# Patient Record
Sex: Male | Born: 1980 | Race: Black or African American | Hispanic: No | Marital: Single | Smoking: Former smoker
Health system: Southern US, Community
[De-identification: ages and names within clinical notes are randomized; demographics above are authoritative.]

## PROBLEM LIST (undated history)

## (undated) DIAGNOSIS — F12288 Cannabis dependence with other cannabis-induced disorder: Secondary | ICD-10-CM

---

## 2012-04-03 ENCOUNTER — Emergency Department (HOSPITAL_COMMUNITY)
Admission: EM | Admit: 2012-04-03 | Discharge: 2012-04-03 | Disposition: A | Discharge status: Home / Self Care | Payer: Self-pay | Attending: Emergency Medicine | Admitting: Emergency Medicine

## 2012-04-03 ENCOUNTER — Encounter (HOSPITAL_COMMUNITY): Payer: Self-pay | Admitting: *Deleted

## 2012-04-03 DIAGNOSIS — R198 Other specified symptoms and signs involving the digestive system and abdomen: Secondary | ICD-10-CM | POA: Insufficient documentation

## 2012-04-03 DIAGNOSIS — R6883 Chills (without fever): Secondary | ICD-10-CM | POA: Insufficient documentation

## 2012-04-03 DIAGNOSIS — R1110 Vomiting, unspecified: Secondary | ICD-10-CM | POA: Insufficient documentation

## 2012-04-03 DIAGNOSIS — M549 Dorsalgia, unspecified: Secondary | ICD-10-CM | POA: Insufficient documentation

## 2012-04-03 LAB — CBC WITH DIFFERENTIAL/PLATELET
Basophils Absolute: 0 10*3/uL (ref 0.0–0.1)
Basophils Relative: 0 % (ref 0–1)
Eosinophils Absolute: 0 10*3/uL (ref 0.0–0.7)
Hemoglobin: 14.4 g/dL (ref 13.0–17.0)
MCH: 29.8 pg (ref 26.0–34.0)
MCHC: 35.2 g/dL (ref 30.0–36.0)
Neutro Abs: 10.8 10*3/uL — ABNORMAL HIGH (ref 1.7–7.7)
Neutrophils Relative %: 87 % — ABNORMAL HIGH (ref 43–77)
Platelets: 195 10*3/uL (ref 150–400)
RDW: 15 % (ref 11.5–15.5)

## 2012-04-03 LAB — COMPREHENSIVE METABOLIC PANEL
AST: 104 U/L — ABNORMAL HIGH (ref 0–37)
Albumin: 3.9 g/dL (ref 3.5–5.2)
Alkaline Phosphatase: 52 U/L (ref 39–117)
BUN: 9 mg/dL (ref 6–23)
Chloride: 99 mEq/L (ref 96–112)
Potassium: 4 mEq/L (ref 3.5–5.1)
Sodium: 135 mEq/L (ref 135–145)
Total Bilirubin: 0.4 mg/dL (ref 0.3–1.2)
Total Protein: 7.2 g/dL (ref 6.0–8.3)

## 2012-04-03 LAB — URINALYSIS, ROUTINE W REFLEX MICROSCOPIC
Bilirubin Urine: NEGATIVE
Glucose, UA: NEGATIVE mg/dL
Leukocytes, UA: NEGATIVE
Nitrite: NEGATIVE
Specific Gravity, Urine: 1.029 (ref 1.005–1.030)
pH: 6.5 (ref 5.0–8.0)

## 2012-04-03 LAB — LIPASE, BLOOD: Lipase: 17 U/L (ref 11–59)

## 2012-04-03 MED ORDER — SODIUM CHLORIDE 0.9 % IV SOLN
Freq: Once | INTRAVENOUS | Status: AC
  Administered 2012-04-03: 07:00:00 via INTRAVENOUS

## 2012-04-03 MED ORDER — PROMETHAZINE HCL 25 MG PO TABS: 25.0000 mg | ORAL_TABLET | Freq: Four times a day (QID) | ORAL | Status: DC | PRN

## 2012-04-03 MED ORDER — ONDANSETRON HCL 4 MG/2ML IJ SOLN
4.0000 mg | Freq: Once | INTRAMUSCULAR | Status: AC
  Administered 2012-04-03: 4 mg via INTRAVENOUS
  Filled 2012-04-03: qty 2

## 2012-04-03 MED ORDER — HYDROMORPHONE HCL PF 1 MG/ML IJ SOLN
1.0000 mg | Freq: Once | INTRAMUSCULAR | Status: AC
  Administered 2012-04-03: 1 mg via INTRAVENOUS
  Filled 2012-04-03: qty 1

## 2012-04-03 NOTE — ED Provider Notes (Signed)
Medical screening examination/treatment/procedure(s) were performed by non-physician practitioner and as supervising physician I was immediately available for consultation/collaboration.   Rhyan Wolters M Nelton A[place], MD 04/03/12 0753 

## 2012-04-03 NOTE — ED Notes (Signed)
Pt complains of generalized stomach pain and cramps with nausea and vomiting since yesterday.

## 2012-04-03 NOTE — ED Provider Notes (Addendum)
History     CSN: [id number]  Arrival date & time 04/03/12  0542   None     Chief Complaint  Patient presents with  . Abdominal Pain  . Nausea  . Emesis    (Consider location/radiation/quality/duration/timing/severity/associated sxs/prior treatment) Patient is a 31 y.o. male presenting with abdominal pain. The history is provided by the patient. No language interpreter was used.  Abdominal Pain The primary symptoms of the illness include abdominal pain. The current episode started yesterday. The onset of the illness was gradual. The problem has been gradually worsening.  The illness is associated with a recent illness. The patient has had a change in bowel habit. Additional symptoms associated with the illness include chills and back pain. Significant associated medical issues do not include PUD or GERD.    History reviewed. No pertinent past medical history.  History reviewed. No pertinent past surgical history.  History reviewed. No pertinent family history.  History  Substance Use Topics  . Smoking status: Not on file  . Smokeless tobacco: Not on file  . Alcohol Use: Not on file      Review of Systems  Constitutional: Positive for chills.  Gastrointestinal: Positive for abdominal pain.  Musculoskeletal: Positive for back pain.  All other systems reviewed and are negative.    Allergies  Review of patient's allergies indicates no known allergies.  Home Medications  No current outpatient prescriptions on file.  BP 136/79  Pulse 58  Temp 98.2 F (36.8 C) (Oral)  Resp 18  SpO2 100%  Physical Exam  Nursing note and vitals reviewed. Constitutional: He is oriented to person, place, and time. He appears well-developed and well-nourished.  HENT:  Head: Normocephalic and atraumatic.  Eyes: Conjunctivae normal and EOM are normal. Pupils are equal, round, and reactive to light.  Neck: Normal range of motion. Neck supple.  Cardiovascular: Normal rate.    Pulmonary/Chest: Effort normal and breath sounds normal.  Abdominal: Soft. Bowel sounds are normal.  Musculoskeletal: Normal range of motion.  Neurological: He is alert and oriented to person, place, and time.  Skin: Skin is warm.  Psychiatric: He has a normal mood and affect.    ED Course  Procedures (including critical care time)  Labs Reviewed - No data to display No results found.   1. Vomiting       MDM  Pt given Iv fluids, dilaudid and zofran for pain,  Labs pending  Labs reviewed,   Pt feels much better, no cramping no vomiting      Lonia Skinner [place], [place] 04/03/12 0710  Lonia Skinner [place], [place] 04/03/12 [phone]

## 2012-04-03 NOTE — ED Provider Notes (Signed)
Medical screening examination/treatment/procedure(s) were performed by non-physician practitioner and as supervising physician I was immediately available for consultation/collaboration.   Arbor Cohen M [place], MD 04/03/12 2327 

## 2012-04-03 NOTE — ED Notes (Signed)
Pt reporting feeling better. No n/v. Pt provided sprite.

## 2012-04-04 ENCOUNTER — Encounter (HOSPITAL_COMMUNITY): Payer: Self-pay | Admitting: *Deleted

## 2012-04-04 ENCOUNTER — Emergency Department (HOSPITAL_COMMUNITY): Payer: Self-pay

## 2012-04-04 ENCOUNTER — Observation Stay (HOSPITAL_COMMUNITY)
Admission: EM | Admit: 2012-04-04 | Discharge: 2012-04-04 | Disposition: A | Discharge status: Home / Self Care | Payer: Self-pay | Attending: Emergency Medicine | Admitting: Emergency Medicine

## 2012-04-04 DIAGNOSIS — R52 Pain, unspecified: Secondary | ICD-10-CM | POA: Insufficient documentation

## 2012-04-04 DIAGNOSIS — R109 Unspecified abdominal pain: Secondary | ICD-10-CM

## 2012-04-04 DIAGNOSIS — R1084 Generalized abdominal pain: Principal | ICD-10-CM | POA: Insufficient documentation

## 2012-04-04 DIAGNOSIS — R197 Diarrhea, unspecified: Secondary | ICD-10-CM | POA: Insufficient documentation

## 2012-04-04 DIAGNOSIS — R112 Nausea with vomiting, unspecified: Secondary | ICD-10-CM | POA: Insufficient documentation

## 2012-04-04 LAB — URINALYSIS, ROUTINE W REFLEX MICROSCOPIC
Ketones, ur: 15 mg/dL — AB
Leukocytes, UA: NEGATIVE
Nitrite: NEGATIVE
Protein, ur: NEGATIVE mg/dL
Urobilinogen, UA: 1 mg/dL (ref 0.0–1.0)

## 2012-04-04 LAB — COMPREHENSIVE METABOLIC PANEL
Alkaline Phosphatase: 47 U/L (ref 39–117)
BUN: 7 mg/dL (ref 6–23)
Chloride: 99 mEq/L (ref 96–112)
Creatinine, Ser: 1.07 mg/dL (ref 0.50–1.35)
GFR calc Af Amer: >90 mL/min (ref >90)
GFR calc non Af Amer: >90 mL/min (ref >90)
Glucose, Bld: 138 mg/dL — ABNORMAL HIGH (ref 70–99)
Potassium: 4.1 mEq/L (ref 3.5–5.1)
Total Bilirubin: 0.5 mg/dL (ref 0.3–1.2)

## 2012-04-04 LAB — CBC WITH DIFFERENTIAL/PLATELET
HCT: 41.6 % (ref 39.0–52.0)
Hemoglobin: 14 g/dL (ref 13.0–17.0)
Lymphs Abs: 0.8 10*3/uL (ref 0.7–4.0)
MCH: 28.7 pg (ref 26.0–34.0)
Monocytes Absolute: 0.3 10*3/uL (ref 0.1–1.0)
Monocytes Relative: 3 % (ref 3–12)
Neutro Abs: 10.4 10*3/uL — ABNORMAL HIGH (ref 1.7–7.7)
Neutrophils Relative %: 90 % — ABNORMAL HIGH (ref 43–77)
RBC: 4.88 MIL/uL (ref 4.22–5.81)

## 2012-04-04 MED ORDER — GI COCKTAIL ~~LOC~~
30.0000 mL | Freq: Once | ORAL | Status: AC
  Administered 2012-04-04: 30 mL via ORAL
  Filled 2012-04-04: qty 30

## 2012-04-04 MED ORDER — SODIUM CHLORIDE 0.9 % IV SOLN: 1000.0000 mL | INTRAVENOUS | Status: DC

## 2012-04-04 MED ORDER — FAMOTIDINE 20 MG PO TABS: 20.0000 mg | ORAL_TABLET | Freq: Two times a day (BID) | ORAL | Status: DC

## 2012-04-04 MED ORDER — FAMOTIDINE IN NACL 20-0.9 MG/50ML-% IV SOLN
20.0000 mg | Freq: Once | INTRAVENOUS | Status: AC
  Administered 2012-04-04: 20 mg via INTRAVENOUS
  Filled 2012-04-04: qty 50

## 2012-04-04 MED ORDER — DICYCLOMINE HCL 10 MG/ML IM SOLN
20.0000 mg | Freq: Once | INTRAMUSCULAR | Status: AC
  Administered 2012-04-04: 20 mg via INTRAMUSCULAR
  Filled 2012-04-04: qty 2

## 2012-04-04 MED ORDER — PROMETHAZINE HCL 25 MG RE SUPP: 25.0000 mg | Freq: Four times a day (QID) | RECTAL | Status: DC | PRN

## 2012-04-04 MED ORDER — ONDANSETRON HCL 4 MG/2ML IJ SOLN
4.0000 mg | Freq: Once | INTRAMUSCULAR | Status: AC
  Administered 2012-04-04: 4 mg via INTRAVENOUS
  Filled 2012-04-04: qty 2

## 2012-04-04 MED ORDER — ONDANSETRON HCL 4 MG PO TABS: 4.0000 mg | ORAL_TABLET | Freq: Four times a day (QID) | ORAL | Status: DC

## 2012-04-04 MED ORDER — ACETAMINOPHEN 325 MG PO TABS: 650.0000 mg | ORAL_TABLET | ORAL | Status: DC | PRN

## 2012-04-04 MED ORDER — SODIUM CHLORIDE 0.9 % IV SOLN
Freq: Once | INTRAVENOUS | Status: AC
  Administered 2012-04-04: 03:00:00 via INTRAVENOUS

## 2012-04-04 MED ORDER — MAGNESIUM HYDROXIDE 400 MG/5ML PO SUSP: 30.0000 mL | Freq: Two times a day (BID) | ORAL | Status: DC | PRN

## 2012-04-04 MED ORDER — FENTANYL CITRATE 0.05 MG/ML IJ SOLN
50.0000 ug | Freq: Once | INTRAMUSCULAR | Status: AC
  Administered 2012-04-04: 03:00:00 via INTRAVENOUS
  Filled 2012-04-04: qty 2

## 2012-04-04 MED ORDER — SODIUM CHLORIDE 0.9 % IV SOLN
1000.0000 mL | Freq: Once | INTRAVENOUS | Status: AC
  Administered 2012-04-04: 1000 mL via INTRAVENOUS

## 2012-04-04 MED ORDER — DICYCLOMINE HCL 20 MG PO TABS: 20.0000 mg | ORAL_TABLET | Freq: Four times a day (QID) | ORAL | Status: DC | PRN

## 2012-04-04 MED ORDER — IOHEXOL 300 MG/ML  SOLN
100.0000 mL | Freq: Once | INTRAMUSCULAR | Status: AC | PRN
  Administered 2012-04-04: 100 mL via INTRAVENOUS

## 2012-04-04 MED ORDER — IOHEXOL 300 MG/ML  SOLN
20.0000 mL | INTRAMUSCULAR | Status: AC
  Administered 2012-04-04: 20 mL via ORAL

## 2012-04-04 MED ORDER — MORPHINE SULFATE 10 MG/ML IJ SOLN: 2.0000 mg | INTRAMUSCULAR | Status: DC | PRN

## 2012-04-04 MED ORDER — ONDANSETRON HCL 4 MG/2ML IJ SOLN: 4.0000 mg | Freq: Four times a day (QID) | INTRAMUSCULAR | Status: DC | PRN

## 2012-04-04 NOTE — ED Provider Notes (Addendum)
History     CSN: [id number]  Arrival date & time 04/04/12  0208   First MD Initiated Contact with Patient 04/04/12 [phone]      Chief Complaint  Patient presents with  . Abdominal Pain    (Consider location/radiation/quality/duration/timing/severity/associated sxs/prior treatment) HPI 31 year old male presents emergency apartment with complaint of persistent diffuse crampy abdominal pain along with nausea and vomiting. Patient was seen in the ER yesterday for same complaint. He was given pain medicine, fluids. He reports a prescription for Phenergan has not helped his symptoms. Patient has not had any further diarrhea, but reports he's not been able to tolerate any oral fluids or food. No prior significant past medical history, no surgical history. He denies any sick contacts, no travel, no unusual foods. Symptoms started late Monday night/early Tuesday morning  History reviewed. No pertinent past medical history.  History reviewed. No pertinent past surgical history.  No family history on file.  History  Substance Use Topics  . Smoking status: Never Smoker   . Smokeless tobacco: Not on file  . Alcohol Use: No      Review of Systems  All other systems reviewed and are negative.    Allergies  Review of patient's allergies indicates no known allergies.  Home Medications   Current Outpatient Rx  Name  Route  Sig  Dispense  Refill  . PROMETHAZINE HCL 25 MG PO TABS   Oral   Take 1 tablet (25 mg total) by mouth every 6 (six) hours as needed for nausea.   15 tablet   0     BP 138/87  Pulse 55  Temp 98.3 F (36.8 C) (Oral)  Resp 18  SpO2 99%  Physical Exam  Nursing note and vitals reviewed. Constitutional: He is oriented to person, place, and time. He appears well-developed and well-nourished. He appears distressed (Uncomfortable appearing).  HENT:  Head: Normocephalic and atraumatic.  Right Ear: External ear normal.  Left Ear: External ear normal.  Nose: Nose  normal.  Mouth/Throat: Oropharynx is clear and moist.  Eyes: Conjunctivae normal and EOM are normal. Pupils are equal, round, and reactive to light.  Neck: Normal range of motion. Neck supple. No JVD present. No tracheal deviation present. No thyromegaly present.  Cardiovascular: Normal rate, regular rhythm, normal heart sounds and intact distal pulses.  Exam reveals no gallop and no friction rub.   No murmur heard. Pulmonary/Chest: Effort normal and breath sounds normal. No stridor. No respiratory distress. He has no wheezes. He has no rales. He exhibits no tenderness.  Abdominal: Soft. He exhibits no distension and no mass. There is no tenderness. There is no rebound and no guarding.       Hyperactive bowel sounds. No specific tenderness with palpation patient indicates entire abdomen as source of his pain  Musculoskeletal: Normal range of motion. He exhibits no edema and no tenderness.  Lymphadenopathy:    He has no cervical adenopathy.  Neurological: He is alert and oriented to person, place, and time. No cranial nerve deficit. He exhibits normal muscle tone. Coordination normal.  Skin: Skin is warm and dry. No rash noted. No erythema. No pallor.  Psychiatric: He has a normal mood and affect. His behavior is normal. Judgment and thought content normal.    ED Course  Procedures (including critical care time)  Labs Reviewed  CBC WITH DIFFERENTIAL - Abnormal; Notable for the following:    WBC 11.5 (*)     Neutrophils Relative 90 (*)     Neutro  Abs 10.4 (*)     Lymphocytes Relative 7 (*)     All other components within normal limits  COMPREHENSIVE METABOLIC PANEL - Abnormal; Notable for the following:    Glucose, Bld 138 (*)     AST 70 (*)     All other components within normal limits  URINALYSIS, ROUTINE W REFLEX MICROSCOPIC - Abnormal; Notable for the following:    Ketones, ur 15 (*)     All other components within normal limits   Ct Abdomen Pelvis W Contrast  04/04/2012   *RADIOLOGY REPORT*  Clinical Data: Diffuse crampy abdominal pain with nausea and vomiting.  CT ABDOMEN AND PELVIS WITH CONTRAST  Technique:  Multidetector CT imaging of the abdomen and pelvis was performed following the standard protocol during bolus administration of intravenous contrast.  Contrast: OMNIPAQUE IOHEXOL 300 MG/ML  SOLN, 1 OMNIPAQUE IOHEXOL 300 MG/ML  SOLN  Comparison: None.  Findings: The lung bases are clear.  The liver, spleen, gallbladder, pancreas, adrenal glands, kidneys, abdominal aorta, and retroperitoneal lymph nodes are unremarkable. The stomach, small bowel, and colon are not abnormally distended. No free air or free fluid in the abdomen.  Pelvis:  There is a small amount of free fluid in the low pelvis which is nonspecific.  Inflammatory process may be present.  Mild prominence of prostate gland.  Bladder is incompletely distended but no obvious bladder wall thickening is appreciated.  No definite evidence of diverticulitis.  The appendix is segmentally demonstrated and appears normal.  Normal alignment of the lumbar vertebrae.  Intramuscular gas in the right gluteal region likely representing injection site.  IMPRESSION: Small amount of nonspecific free fluid in the pelvis.  Inflammatory process is not excluded.  Prostate gland is mildly enlarged.   Original Report Authenticated By: Burman Nieves, M.D.      1. Abdominal pain, acute   2. Nausea vomiting and diarrhea       MDM  31 year old male with persistent diffuse crampy abdominal pain with nausea and vomiting despite Phenergan. Given symptoms, will check CT abdomen pelvis he did have slightly displaces yesterday. We'll give Bentyl, Pepcid, fentanyl and Zofran as well as IV fluids.        Olivia Mackie, MD 04/04/12 0330  CT scan without significant abnormality.  Pt feeling somewhat better, will try PO trial.  If unable to tolerate, will move to CDU on dehydration protocol  Olivia Mackie, MD 04/04/12  1610    Olivia Mackie, MD 04/04/12 [phone]

## 2012-04-04 NOTE — ED Notes (Signed)
Here last night for similar symptoms; symptoms have worsened.

## 2012-04-04 NOTE — ED Notes (Signed)
Family at bedside. 

## 2012-04-04 NOTE — Progress Notes (Signed)
Utilization review completed.  P.J. Andora Krull,RN,BSN Case Manager [phone]  

## 2012-04-04 NOTE — ED Notes (Signed)
IV has been removed for discharge

## 2012-04-04 NOTE — ED Notes (Signed)
Pt was d/c'd from ED yesterday from abd pain.  He states it is NOT resolved, pain is increased pt continues to vomit, but denies diarrhea, stating he can't keep anything down.

## 2012-04-04 NOTE — ED Notes (Addendum)
Sprite given, per pt.s request.  Pt. Reports that he is feeling better

## 2012-07-10 ENCOUNTER — Encounter (HOSPITAL_COMMUNITY): Payer: Self-pay | Admitting: Emergency Medicine

## 2012-07-10 ENCOUNTER — Emergency Department (HOSPITAL_COMMUNITY)
Admission: EM | Admit: 2012-07-10 | Discharge: 2012-07-10 | Disposition: A | Discharge status: Home / Self Care | Payer: Self-pay | Attending: Emergency Medicine | Admitting: Emergency Medicine

## 2012-07-10 DIAGNOSIS — F17200 Nicotine dependence, unspecified, uncomplicated: Secondary | ICD-10-CM | POA: Insufficient documentation

## 2012-07-10 DIAGNOSIS — R197 Diarrhea, unspecified: Secondary | ICD-10-CM | POA: Insufficient documentation

## 2012-07-10 DIAGNOSIS — R112 Nausea with vomiting, unspecified: Secondary | ICD-10-CM | POA: Insufficient documentation

## 2012-07-10 LAB — CBC WITH DIFFERENTIAL/PLATELET
Basophils Absolute: 0 10*3/uL (ref 0.0–0.1)
Basophils Relative: 0 % (ref 0–1)
Eosinophils Relative: 0 % (ref 0–5)
HCT: 40.9 % (ref 39.0–52.0)
Lymphocytes Relative: 4 % — ABNORMAL LOW (ref 12–46)
MCHC: 35 g/dL (ref 30.0–36.0)
MCV: 89.1 fL (ref 78.0–100.0)
Monocytes Absolute: 0.5 10*3/uL (ref 0.1–1.0)
Platelets: 170 10*3/uL (ref 150–400)
RDW: 13.7 % (ref 11.5–15.5)
WBC: 16.1 10*3/uL — ABNORMAL HIGH (ref 4.0–10.5)

## 2012-07-10 LAB — COMPREHENSIVE METABOLIC PANEL
ALT: 20 U/L (ref 0–53)
AST: 30 U/L (ref 0–37)
Albumin: 4 g/dL (ref 3.5–5.2)
CO2: 23 mEq/L (ref 19–32)
Calcium: 8.9 mg/dL (ref 8.4–10.5)
Creatinine, Ser: 0.93 mg/dL (ref 0.50–1.35)
Sodium: 138 mEq/L (ref 135–145)
Total Protein: 6.9 g/dL (ref 6.0–8.3)

## 2012-07-10 MED ORDER — ONDANSETRON HCL 4 MG/2ML IJ SOLN: 4.0000 mg | Freq: Once | INTRAMUSCULAR | Status: DC

## 2012-07-10 MED ORDER — ONDANSETRON HCL 4 MG/2ML IJ SOLN
INTRAMUSCULAR | Status: AC
  Filled 2012-07-10: qty 2

## 2012-07-10 MED ORDER — SODIUM CHLORIDE 0.9 % IV BOLUS (SEPSIS)
1000.0000 mL | Freq: Once | INTRAVENOUS | Status: AC
  Administered 2012-07-10: 1000 mL via INTRAVENOUS

## 2012-07-10 MED ORDER — DICYCLOMINE HCL 20 MG PO TABS: 20.0000 mg | ORAL_TABLET | Freq: Two times a day (BID) | ORAL | Status: DC

## 2012-07-10 MED ORDER — ONDANSETRON HCL 4 MG/2ML IJ SOLN
4.0000 mg | Freq: Once | INTRAMUSCULAR | Status: AC
  Administered 2012-07-10: 4 mg via INTRAVENOUS

## 2012-07-10 MED ORDER — KETOROLAC TROMETHAMINE 30 MG/ML IJ SOLN
30.0000 mg | Freq: Once | INTRAMUSCULAR | Status: AC
  Administered 2012-07-10: 30 mg via INTRAVENOUS
  Filled 2012-07-10: qty 1

## 2012-07-10 MED ORDER — DICYCLOMINE HCL 10 MG/ML IM SOLN
20.0000 mg | Freq: Once | INTRAMUSCULAR | Status: AC
  Administered 2012-07-10: 20 mg via INTRAMUSCULAR
  Filled 2012-07-10: qty 2

## 2012-07-10 MED ORDER — OXYCODONE-ACETAMINOPHEN 5-325 MG PO TABS
2.0000 | ORAL_TABLET | Freq: Once | ORAL | Status: AC
  Administered 2012-07-10: 2 via ORAL
  Filled 2012-07-10: qty 2

## 2012-07-10 MED ORDER — PROMETHAZINE HCL 25 MG PO TABS: 25.0000 mg | ORAL_TABLET | Freq: Four times a day (QID) | ORAL | Status: DC | PRN

## 2012-07-10 NOTE — ED Notes (Signed)
Patient with 2 day history of abdominal pain, nausea and vomiting.  Patient states he has been having diarrhea also.  Patient states that he has not been able to keep any food or liquids down in two days.  Patient is CAOx3.

## 2012-07-10 NOTE — ED Notes (Signed)
Patient continuing to vomit at this time.  PA at bedside with Zofran order.

## 2012-07-10 NOTE — ED Notes (Signed)
Patient actively vomiting at this time. 

## 2012-07-10 NOTE — ED Provider Notes (Signed)
History     CSN: [id number]  Arrival date & time 07/10/12  4098   First MD Initiated Contact with Patient 07/10/12 [phone]      Chief Complaint  Patient presents with  . Abdominal Pain    (Consider location/radiation/quality/duration/timing/severity/associated sxs/prior treatment) HPI Comments: Patient presents with a chief complaint of nausea, vomiting, diarrhea, and generalized abdominal pain.  He reports that his symptoms began two days ago.  He reports numerous episodes of both vomiting and diarrhea.  No blood in his emesis or blood in his stool.  He reports that he has been unable to tolerate liquids or food.  He denies fever or chills.  Abdominal pain is generalized and does not radiate.  He has not taken anything for his symptoms prior to arrival in the ED.  He states that he had similar symptoms in December and was diagnosed with a "stomach virus."    The history is provided by the patient.    History reviewed. No pertinent past medical history.  History reviewed. No pertinent past surgical history.  History reviewed. No pertinent family history.  History  Substance Use Topics  . Smoking status: Current Every Day Smoker    Types: Cigarettes  . Smokeless tobacco: Not on file  . Alcohol Use: No      Review of Systems  Constitutional: Negative for fever and chills.  Gastrointestinal: Positive for nausea, vomiting, abdominal pain and diarrhea. Negative for constipation, blood in stool and abdominal distention.  Genitourinary: Negative for dysuria, frequency and hematuria.  Neurological: Negative for dizziness, syncope and light-headedness.  All other systems reviewed and are negative.    Allergies  Review of patient's allergies indicates no known allergies.  Home Medications   Current Outpatient Rx  Name  Route  Sig  Dispense  Refill  . ibuprofen (ADVIL,MOTRIN) 200 MG tablet   Oral   Take 200 mg by mouth every 6 (six) hours as needed for pain.           BP  131/66  Temp(Src) 98.1 F (36.7 C) (Oral)  Resp 18  SpO2 100%  Physical Exam  Nursing note and vitals reviewed. Constitutional: He appears well-developed and well-nourished. No distress.  HENT:  Head: Normocephalic and atraumatic.  Mouth/Throat: Oropharynx is clear and moist.  Neck: Normal range of motion. Neck supple.  Cardiovascular: Normal rate, regular rhythm and normal heart sounds.   Pulmonary/Chest: Effort normal and breath sounds normal.  Abdominal: Soft. Bowel sounds are normal. He exhibits no distension and no mass. There is generalized tenderness. There is no rigidity, no rebound, no guarding and no tenderness at McBurney's point.  Neurological: He is alert.  Skin: Skin is warm and dry. He is not diaphoretic.  Psychiatric: He has a normal mood and affect.    ED Course  Procedures (including critical care time)  Labs Reviewed - No data to display No results found.   No diagnosis found.  7:34 AM Reassessed patient.  He reports that his nausea is improving.  Will fluid challenge and reassess.  MDM  Patient presents with nausea, vomiting, diarrhea and generalized abdominal pain.  Patient afebrile.  Labs unremarkable.  No rebound or guarding on abdominal exam.  Patient able to tolerate PO liquids.  Therefore, feel that the patient is stable for discharge.         Pascal Lux [place], PA-C 07/10/12 [phone]

## 2012-07-14 NOTE — ED Provider Notes (Signed)
Medical screening examination/treatment/procedure(s) were performed by non-physician practitioner and as supervising physician I was immediately available for consultation/collaboration.   Carlisle Beers Orabelle Rylee, MD 07/14/12 2250

## 2013-04-11 ENCOUNTER — Emergency Department (INDEPENDENT_AMBULATORY_CARE_PROVIDER_SITE_OTHER)
Admission: EM | Admit: 2013-04-11 | Discharge: 2013-04-11 | Disposition: A | Discharge status: Home / Self Care | Payer: Self-pay | Source: Home / Self Care | Attending: Family Medicine | Admitting: Family Medicine

## 2013-04-11 ENCOUNTER — Other Ambulatory Visit (HOSPITAL_COMMUNITY)
Admission: RE | Admit: 2013-04-11 | Discharge: 2013-04-11 | Disposition: A | Discharge status: Home / Self Care | Payer: Self-pay | Source: Ambulatory Visit | Attending: Family Medicine | Admitting: Family Medicine

## 2013-04-11 ENCOUNTER — Encounter (HOSPITAL_COMMUNITY): Payer: Self-pay | Admitting: Emergency Medicine

## 2013-04-11 DIAGNOSIS — A510 Primary genital syphilis: Secondary | ICD-10-CM

## 2013-04-11 DIAGNOSIS — Z113 Encounter for screening for infections with a predominantly sexual mode of transmission: Secondary | ICD-10-CM | POA: Insufficient documentation

## 2013-04-11 MED ORDER — PENICILLIN G BENZATHINE 1200000 UNIT/2ML IM SUSP
INTRAMUSCULAR | Status: AC
  Filled 2013-04-11: qty 4

## 2013-04-11 MED ORDER — PENICILLIN G BENZATHINE 1200000 UNIT/2ML IM SUSP
2.4000 10*6.[IU] | Freq: Once | INTRAMUSCULAR | Status: AC
  Administered 2013-04-11: 2.4 10*6.[IU] via INTRAMUSCULAR

## 2013-04-11 NOTE — ED Provider Notes (Signed)
Corey Atkinson is a 32 y.o. male who presents to Urgent Care today for penis lesion. Patient has a painless sore on the left side of the glans of his penis. This is been present for about 3 weeks. He denies any injury. Additionally he notes swollen inguinal lymph nodes. He denies any fevers or chills nausea vomiting or diarrhea. He denies any penile discharge. He has had sex but wears a condom most of the time.   History reviewed. No pertinent past medical history. History  Substance Use Topics  . Smoking status: Current Every Day Smoker    Types: Cigarettes  . Smokeless tobacco: Not on file  . Alcohol Use: No   ROS as above Medications reviewed. No current facility-administered medications for this encounter.   No current outpatient prescriptions on file.    Exam:  BP 145/71  Pulse 51  Temp(Src) 98.3 F (36.8 C) (Oral)  Resp 16  SpO2 97% Gen: Well NAD HEENT: EOMI,  MMM Groin: Large nontender bilateral inguinal lymphadenopathy present. Penis: Circumcised penis. 1 cm chancer present.   Assessment and Plan: 32 y.o. male with primary syphilis. Empiric treatment with 2.4 million units of Bicillin IM. Urine cytology for gonorrhea, Chlamydia, trichomonas pending. HIV and RPR pending Patient will followup with health Department. Discussed warning signs or symptoms. Please see discharge instructions. Patient expresses understanding.      Rodolph Bong, MD 04/11/13 [phone]

## 2013-04-11 NOTE — ED Notes (Signed)
C/o groin area pain and rash on penis Denies any discharge

## 2013-04-12 LAB — RPR: RPR Ser Ql: REACTIVE — AB

## 2013-04-12 LAB — RPR TITER: RPR Titer: 1:16 {titer} — AB

## 2013-04-14 LAB — T.PALLIDUM AB, IGG: T pallidum Antibodies (TP-PA): >8 S/CO — ABNORMAL HIGH (ref <0.90)

## 2013-04-20 ENCOUNTER — Telehealth (HOSPITAL_COMMUNITY): Payer: Self-pay | Admitting: *Deleted

## 2013-04-21 NOTE — ED Notes (Signed)
12/23  GC/Chlamydia/Trich neg., HIV non-reactive, RPR reactive, RPR titer 1:16, T pallidum antibodies >8.00 H.  Pt. adequately treated with 2.4 million units LA Bicillin.  12/28 I called pt. Pt. verified x 2 and given results.  Pt. told he was adequately treated with PCN.  Pt. instructed to notify his partner, no sex for 1 week and to practice safe sex. Pt. told he should get HIV rechecked in 6 mos. at the [facility] STD clinic, by appointment.  He should get RPR rechecked to make sure it is completely gone. He may always show reactive from now on but the antibodies should be less than 0.90. Pt. voiced understanding.  Fax machine not working.  12/29 DHHS form faxed to the [employer]. [name] 04/21/2013

## 2013-09-23 ENCOUNTER — Emergency Department (HOSPITAL_COMMUNITY): Payer: No Typology Code available for payment source

## 2013-09-23 ENCOUNTER — Encounter (HOSPITAL_COMMUNITY): Payer: Self-pay | Admitting: Emergency Medicine

## 2013-09-23 ENCOUNTER — Emergency Department (HOSPITAL_COMMUNITY)
Admission: EM | Admit: 2013-09-23 | Discharge: 2013-09-24 | Disposition: A | Discharge status: Home / Self Care | Payer: No Typology Code available for payment source | Attending: Emergency Medicine | Admitting: Emergency Medicine

## 2013-09-23 ENCOUNTER — Other Ambulatory Visit: Payer: Self-pay

## 2013-09-23 ENCOUNTER — Emergency Department (HOSPITAL_COMMUNITY)
Admission: EM | Admit: 2013-09-23 | Discharge: 2013-09-23 | Disposition: A | Discharge status: Home / Self Care | Payer: No Typology Code available for payment source | Attending: Emergency Medicine | Admitting: Emergency Medicine

## 2013-09-23 DIAGNOSIS — R109 Unspecified abdominal pain: Secondary | ICD-10-CM | POA: Insufficient documentation

## 2013-09-23 DIAGNOSIS — F17200 Nicotine dependence, unspecified, uncomplicated: Secondary | ICD-10-CM | POA: Insufficient documentation

## 2013-09-23 DIAGNOSIS — R1110 Vomiting, unspecified: Secondary | ICD-10-CM | POA: Insufficient documentation

## 2013-09-23 DIAGNOSIS — Z79899 Other long term (current) drug therapy: Secondary | ICD-10-CM | POA: Insufficient documentation

## 2013-09-23 DIAGNOSIS — D72829 Elevated white blood cell count, unspecified: Secondary | ICD-10-CM | POA: Insufficient documentation

## 2013-09-23 DIAGNOSIS — R1084 Generalized abdominal pain: Secondary | ICD-10-CM | POA: Insufficient documentation

## 2013-09-23 DIAGNOSIS — R112 Nausea with vomiting, unspecified: Secondary | ICD-10-CM | POA: Insufficient documentation

## 2013-09-23 DIAGNOSIS — R197 Diarrhea, unspecified: Secondary | ICD-10-CM

## 2013-09-23 LAB — COMPREHENSIVE METABOLIC PANEL
ALT: 23 U/L (ref 0–53)
AST: 32 U/L (ref 0–37)
Albumin: 4.4 g/dL (ref 3.5–5.2)
Alkaline Phosphatase: 56 U/L (ref 39–117)
BUN: 12 mg/dL (ref 6–23)
CALCIUM: 9.5 mg/dL (ref 8.4–10.5)
CO2: 26 meq/L (ref 19–32)
CREATININE: 1.1 mg/dL (ref 0.50–1.35)
Chloride: 102 mEq/L (ref 96–112)
GFR, EST NON AFRICAN AMERICAN: 88 mL/min — AB (ref >90)
GLUCOSE: 111 mg/dL — AB (ref 70–99)
Potassium: 3.7 mEq/L (ref 3.7–5.3)
Sodium: 141 mEq/L (ref 137–147)
Total Bilirubin: 0.6 mg/dL (ref 0.3–1.2)
Total Protein: 7.8 g/dL (ref 6.0–8.3)

## 2013-09-23 LAB — CBC WITH DIFFERENTIAL/PLATELET
Basophils Absolute: 0 10*3/uL (ref 0.0–0.1)
Basophils Relative: 0 % (ref 0–1)
EOS PCT: 0 % (ref 0–5)
Eosinophils Absolute: 0 10*3/uL (ref 0.0–0.7)
HCT: 41.6 % (ref 39.0–52.0)
Hemoglobin: 14.5 g/dL (ref 13.0–17.0)
LYMPHS ABS: 1.9 10*3/uL (ref 0.7–4.0)
LYMPHS PCT: 16 % (ref 12–46)
MCH: 32.3 pg (ref 26.0–34.0)
MCHC: 34.9 g/dL (ref 30.0–36.0)
MCV: 92.7 fL (ref 78.0–100.0)
MONO ABS: 0.5 10*3/uL (ref 0.1–1.0)
Monocytes Relative: 4 % (ref 3–12)
Neutro Abs: 9.1 10*3/uL — ABNORMAL HIGH (ref 1.7–7.7)
Neutrophils Relative %: 80 % — ABNORMAL HIGH (ref 43–77)
PLATELETS: 194 10*3/uL (ref 150–400)
RBC: 4.49 MIL/uL (ref 4.22–5.81)
RDW: 13.7 % (ref 11.5–15.5)
WBC: 11.5 10*3/uL — ABNORMAL HIGH (ref 4.0–10.5)

## 2013-09-23 LAB — URINALYSIS, ROUTINE W REFLEX MICROSCOPIC
BILIRUBIN URINE: NEGATIVE
Glucose, UA: NEGATIVE mg/dL
KETONES UR: 40 mg/dL — AB
NITRITE: NEGATIVE
PROTEIN: 30 mg/dL — AB
Specific Gravity, Urine: 1.037 — ABNORMAL HIGH (ref 1.005–1.030)
UROBILINOGEN UA: 1 mg/dL (ref 0.0–1.0)
pH: 6 (ref 5.0–8.0)

## 2013-09-23 LAB — I-STAT TROPONIN, ED: TROPONIN I, POC: 0 ng/mL (ref 0.00–0.08)

## 2013-09-23 LAB — URINE MICROSCOPIC-ADD ON

## 2013-09-23 LAB — LIPASE, BLOOD: LIPASE: 20 U/L (ref 11–59)

## 2013-09-23 MED ORDER — SODIUM CHLORIDE 0.9 % IV BOLUS (SEPSIS)
1000.0000 mL | Freq: Once | INTRAVENOUS | Status: AC
  Administered 2013-09-23: 1000 mL via INTRAVENOUS

## 2013-09-23 MED ORDER — HYDROCODONE-ACETAMINOPHEN 5-325 MG PO TABS
2.0000 | ORAL_TABLET | Freq: Once | ORAL | Status: DC
  Filled 2013-09-23: qty 2

## 2013-09-23 MED ORDER — SODIUM CHLORIDE 0.9 % IV SOLN
Freq: Once | INTRAVENOUS | Status: AC
  Administered 2013-09-23: 05:00:00 via INTRAVENOUS

## 2013-09-23 MED ORDER — IOHEXOL 300 MG/ML  SOLN
20.0000 mL | INTRAMUSCULAR | Status: AC
  Administered 2013-09-23: 20 mL via ORAL

## 2013-09-23 MED ORDER — IOHEXOL 300 MG/ML  SOLN
100.0000 mL | Freq: Once | INTRAMUSCULAR | Status: AC | PRN
  Administered 2013-09-23: 100 mL via INTRAVENOUS

## 2013-09-23 MED ORDER — ONDANSETRON HCL 4 MG/2ML IJ SOLN
4.0000 mg | Freq: Once | INTRAMUSCULAR | Status: AC
  Administered 2013-09-23: 4 mg via INTRAVENOUS
  Filled 2013-09-23: qty 2

## 2013-09-23 MED ORDER — FENTANYL CITRATE 0.05 MG/ML IJ SOLN
50.0000 ug | Freq: Once | INTRAMUSCULAR | Status: AC
  Administered 2013-09-23: 50 ug via INTRAVENOUS
  Filled 2013-09-23: qty 2

## 2013-09-23 MED ORDER — HYDROCODONE-ACETAMINOPHEN 5-325 MG PO TABS: 2.0000 | ORAL_TABLET | ORAL | Status: DC | PRN

## 2013-09-23 MED ORDER — PROMETHAZINE HCL 25 MG PO TABS: 25.0000 mg | ORAL_TABLET | Freq: Four times a day (QID) | ORAL | Status: DC | PRN

## 2013-09-23 MED ORDER — MORPHINE SULFATE 4 MG/ML IJ SOLN
4.0000 mg | Freq: Once | INTRAMUSCULAR | Status: AC
  Administered 2013-09-23: 4 mg via INTRAVENOUS
  Filled 2013-09-23: qty 1

## 2013-09-23 NOTE — ED Notes (Addendum)
Presents with nausea/vomiting and abdominal pain. Pt left from our ER this AM with RX for hydrocodone and phergan that was not filled.  Vomiting at triage. C/o abdominal pain.

## 2013-09-23 NOTE — ED Provider Notes (Signed)
CSN: [id number]     Arrival date & time 09/23/13  0034 History   First MD Initiated Contact with Patient 09/23/13 0204     Chief Complaint  Patient presents with  . Abdominal Pain  . 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 He states that he was [place] the cour[place] this [place], he had a banana for breakfast and after his court appearance he had a hot dog from a street vendor outside the courthouse. A short time after he ate he started to develop diffuse mild abdominal tenderness with associated nausea vomiting and occasional diarrhea. The symptoms are persistent, nothing seems to make it better or worse, no associated blood in the stools. He did have some yellow emesis on arrival. The patient did not have anything to eat after that hot dog today. He denies any radiation to his lower back, denies any swelling of the lower extremities, denies any chest pain shortness of breath coughing and has no fevers or chills.  Patient is a 33 y.o. male presenting with abdominal pain and vomiting. The history is provided by the patient.  Abdominal Pain Associated symptoms: vomiting   Emesis Associated symptoms: abdominal pain     History reviewed. No pertinent past medical history. History reviewed. No pertinent past surgical history. No family history on file. History  Substance Use Topics  . Smoking status: Current Every Day Smoker    Types: Cigarettes  . Smokeless tobacco: Not on file  . Alcohol Use: No    Review of Systems  Gastrointestinal: Positive for vomiting and abdominal pain.  All other systems reviewed and are negative.     Allergies  Review of patient's allergies indicates no known allergies.  Home Medications   Prior to Admission medications   Medication Sig Start Date End Date  Taking? Authorizing Provider  HYDROcodone-acetaminophen (NORCO/VICODIN) 5-325 MG per tablet Take 2 tablets by mouth every 4 (four) hours as needed. 09/23/13   Vida Roller, MD  promethazine (PHENERGAN) 25 MG tablet Take 1 tablet (25 mg total) by mouth every 6 (six) hours as needed for nausea or vomiting. 09/23/13   Vida Roller, MD   BP 119/60  Pulse 49  Temp(Src) 97.8 F (36.6 C) (Oral)  Resp 16  SpO2 99% Physical Exam  Nursing note and vitals reviewed. Constitutional: He appears well-developed and well-nourished. No distress.  HENT:  Head: Normocephalic and atraumatic.  Mouth/Throat: Oropharynx is clear and moist. No oropharyngeal exudate.  Eyes: Conjunctivae and EOM are normal. Pupils are equal, round, and reactive to light. Right eye exhibits no discharge. Left eye exhibits no discharge. No scleral icterus.  Neck: Normal range of motion. Neck supple. No JVD present. No thyromegaly present.  Cardiovascular: Normal rate, regular rhythm, normal heart sounds and intact distal pulses.  Exam reveals no gallop and no friction rub.   No murmur heard. Pulmonary/Chest: Effort normal and breath sounds normal. No respiratory distress. He has no wheezes. He has no rales.  Abdominal: Soft. Bowel sounds are normal. He exhibits no distension and no mass. There is no tenderness.  On exam the patient has no abdominal tenderness, very soft  Musculoskeletal: Normal range of motion. He exhibits no edema and no tenderness.  Lymphadenopathy:    He has no cervical adenopathy.  Neurological: He is alert. Coordination normal.  Skin: Skin is warm and dry. No rash noted.  No erythema.  Psychiatric: He has a normal mood and affect. His behavior is normal.    ED Course  Procedures (including critical care time) Labs Review Labs Reviewed  CBC WITH DIFFERENTIAL - Abnormal; Notable for the following:    WBC 11.5 (*)    Neutrophils Relative % 80 (*)    Neutro Abs 9.1 (*)    All other components within normal  limits  COMPREHENSIVE METABOLIC PANEL - Abnormal; Notable for the following:    Glucose, Bld 111 (*)    GFR calc non Af Amer 88 (*)    All other components within normal limits  URINALYSIS, ROUTINE W REFLEX MICROSCOPIC - Abnormal; Notable for the following:    Specific Gravity, Urine 1.037 (*)    Hgb urine dipstick TRACE (*)    Ketones, ur 40 (*)    Protein, ur 30 (*)    Leukocytes, UA SMALL (*)    All other components within normal limits  URINE CULTURE  LIPASE, BLOOD  URINE MICROSCOPIC-ADD ON  I-STAT TROPOININ, ED    Imaging Review Ct Abdomen Pelvis W Contrast  09/23/2013   CLINICAL DATA:  Abdominal pain and emesis.  EXAM: CT ABDOMEN AND PELVIS WITH CONTRAST  TECHNIQUE: Multidetector CT imaging of the abdomen and pelvis was performed using the standard protocol following bolus administration of intravenous contrast.  CONTRAST:  100mL OMNIPAQUE IOHEXOL 300 MG/ML  SOLN  COMPARISON:  CT of the abdomen and pelvis April 04, 2012.  FINDINGS: Included view of the lung bases are clear. Visualized heart and pericardium are unremarkable.  The spleen, gallbladder, pancreas and adrenal glands are unremarkable. 19 mm low-density lesion in the central liver showed some centripetal enhancement of prior examination and likely reflects hemangioma.  The stomach, small and large bowel are normal in course and caliber without inflammatory changes. Enteric contrast has not yet reached the distal small bowel. The appendix is not discretely identified, however there are no inflammatory changes in the right lower quadrant. Small amount of free fluid in the pelvis. No a pneumoperitoneum.  Kidneys are orthotopic, demonstrating symmetric enhancement without nephrolithiasis, hydronephrosis or renal masses. The unopacified ureters are normal in course and caliber. Delayed imaging through the kidneys demonstrates symmetric prompt excretion to the proximal urinary collecting system. Urinary bladder is partially distended  and unremarkable.  Aortoiliac vessels are normal in course and caliber. Prostate is 5 cm in transaxial dimension. The soft tissues and included osseous structures are nonsuspicious.  IMPRESSION: Small amount of free fluid in the pelvis, limited assessment for bowel pathology as contrast has yet to reach the distal small bowel and, overall paucity of intra-abdominal fat. Stable prostatomegaly, unclear if there could be a component of prostatitis.   Electronically Signed   By: Awilda Metroourtnay  Bloomer   On: 09/23/2013 06:39     MDM   Final diagnoses:  Abdominal pain    The patient has no focal tenderness, there is no abdominal tenderness or guarding and no masses. His bowel sounds are normal, vital signs are normal, laboratory workup shows a leukocytosis of 11,500 but otherwise normal compressive metabolic panel and lipase. Urinalysis pending however I feel that the patient's symptoms have totally resolved with medication including fentanyl 50 mcg one dose. He also received Zofran before that. We'll provide some IV fluids and a short observational period  CT neg for acute findings - pt feels pain free after meds.    Pt informed of results - VS normal, stable for d/c.  He has no urinary  or pelvic sx to suggest prosatitis.  Meds given in ED:  Medications  iohexol (OMNIPAQUE) 300 MG/ML solution 20 mL (20 mLs Oral Contrast Given 09/23/13 0511)  ondansetron (ZOFRAN) injection 4 mg (4 mg Intravenous Given 09/23/13 0106)  fentaNYL (SUBLIMAZE) injection 50 mcg (50 mcg Intravenous Given 09/23/13 0136)  sodium chloride 0.9 % bolus 1,000 mL (0 mLs Intravenous Stopped 09/23/13 0302)  morphine 4 MG/ML injection 4 mg (4 mg Intravenous Given 09/23/13 0434)  0.9 %  sodium chloride infusion ( Intravenous New Bag/Given 09/23/13 0434)  sodium chloride 0.9 % bolus 1,000 mL (0 mLs Intravenous Stopped 09/23/13 0509)  iohexol (OMNIPAQUE) 300 MG/ML solution 100 mL (100 mLs Intravenous Contrast Given 09/23/13 0621)    New Prescriptions    HYDROCODONE-ACETAMINOPHEN (NORCO/VICODIN) 5-325 MG PER TABLET    Take 2 tablets by mouth every 4 (four) hours as needed.   PROMETHAZINE (PHENERGAN) 25 MG TABLET    Take 1 tablet (25 mg total) by mouth every 6 (six) hours as needed for nausea or vomiting.      Vida Roller, MD 09/23/13 [phone]

## 2013-09-23 NOTE — Discharge Instructions (Signed)
°Emergency Department Resource Guide °1) Find a Doctor and Pay Out of Pocket °Although you won't have to find out who is covered by your insurance plan, it is a good idea to ask around and get recommendations. You will then need to call the office and see if the doctor you have chosen will accept you as a new patient and what types of options they offer for patients who are self-pay. Some doctors offer discounts or will set up payment plans for their patients who do not have insurance, but you will need to ask so you aren't surprised when you get to your appointment. ° °2) Contact Your Local Health Department °Not all health departments have doctors that can see patients for sick visits, but many do, so it is worth a call to see if yours does. If you don't know where your local health department is, you can check in your phone book. The CDC also has a tool to help you locate your state's health department, and many state websites also have listings of all of their local health departments. ° °3) Find a Walk-in Clinic °If your illness is not likely to be very severe or complicated, you may want to try a walk in clinic. These are popping up all over the country in pharmacies, drugstores, and shopping centers. They're usually staffed by nurse practitioners or physician assistants that have been trained to treat common illnesses and complaints. They're usually fairly quick and inexpensive. However, if you have serious medical issues or chronic medical problems, these are probably not your best option. ° °No Primary Care Doctor: °- Call [facility] at  [phone] - they can help you locate a primary care doctor that  accepts your insurance, provides certain services, etc. °- Physician Referral Service- 1-800-533-3463 ° °Chronic Pain Problems: °Organization         Address  Phone   Notes  °[facility]  ([phone]) [phone] Patients need to be referred by their primary care doctor.  ° °Medication  Assistance: °Organization         Address  Phone   Notes  °[place] [facility] [address] °[place], [place] [address] (336) 641-8030 --Must be a resident of [place] °-- Must have NO insurance coverage whatsoever (no Medicaid/ Medicare, etc.) °-- The pt. MUST have a primary care doctor that directs their care regularly and follows them in the community °  °[facility]  (866) 331-1348   °[employer]  (888) 892-1162   ° °Agencies that provide inexpensive medical care: °Organization         Address  Phone   Notes  °[facility]  (336) 832-8035   °[facility]    (336) 832-7272   °[facility] [address] °[place], [place] [address] (336) 832-4777   °[facility] [address], °[place] (336) 271-4999   °[facility]    (336) 373-0678   °[facility]    (336) 272-1050   °[facility] ° [address], [place] Phone:  (336) 832-4444, Fax:  (336) 832-4440 Hours of Operation:  9 am - 6 pm, M-F.  Also accepts Medicaid/Medicare and self-pay.  °[facility] ° [address],  Phone: (336) 832-3150, Fax: (336) [phone]. Hours of Operation:  8:30 am - 5:30 pm, M-F.  Also accepts Medicaid and self-pay.  °[facility] [place] [address], [place] Phone: (336) 878-6027   °[facility] [address], [place], [place] (336)723-1848, Ext. 123 Mondays & Thursdays: 7-9 AM.  First 15 patients are seen on a first come, first serve basis. °  ° °Medicaid-accepting [place] Providers: ° °Organization         Address  Phone   Notes  °[facility] [address], [place] (336) 641-2100 Also accepts self-pay patients.  °[facility] [address], [place] ° (336) 856-9996   °[facility] [address], [place]  (336) 288-8857   °[facility] [address] [place] [address], [place] (336) 299-7000   °Veita Bland [address], [place]  ° (336) 373-1557 Only accepts [place] Access Medicaid patients after they have their name applied to their card.  ° °Self-Pay (no insurance) in [place]: ° °Organization         Address  Phone   Notes  °Sickle Cell Patients, [facility] [address], [place] (336) 832-1970   °[facility] [address], [place] (336) 832-4400   °[facility] ° [address],  (336) 992-4800   °[facility]/Dr. Osei-Bonsu ° [address] [place] [address], [place] or [address], [place] (336) [phone] Phone number for both [place] and [place] locations is the same.  °[facility] [address], [place] (336) 299-0000   °[facility] [address] [place] [address], [place] or [address] (336) 852-7530 °(336) 878-2260   °[facility] [address], [place] (336) 350-1642, phone; (336) 294-5005, fax Sees patients 1st and 3rd Saturday of every month.  Must not qualify for public or private insurance (i.e. Medicaid, Medicare, [place] Health Choice, Veterans' Benefits) • Household income should be no more than 200% of the poverty level •The clinic cannot treat you if you are pregnant or think you are pregnant • Sexually transmitted diseases are not treated at the clinic.  ° ° °Dental Care: °Organization         Address  Phone  Notes  °[place] [facility] [address], [place] (336) 641-6152 Accepts children up to age 21 who are enrolled in Medicaid or [place] Health Choice; pregnant women with a Medicaid card; and children who have applied for Medicaid or [place] Health Choice, but were declined, whose parents can pay a reduced fee at time of service.  °[place]  [facility] [place]  [address], [place] (336) 641-7733 Accepts children up to age 21 who are enrolled in Medicaid or [place] Health Choice; pregnant women with a Medicaid card; and children who have applied for Medicaid or [place] Health Choice, but were declined, whose parents can pay a reduced fee at time of service.  °[facility] ° [address],  (336) 641-4533 Patients are seen by appointment only. Walk-ins are not accepted. [facility] will see patients 18 years of age and older. °Monday - Tuesday (8am-5pm) °Most Wednesdays (8:30-5pm) °$30 per visit, cash only  °[facility] ° [address], [place] (336) 641-4533 Patients are seen by appointment only. Walk-ins are not accepted. [facility] will see patients 18 years of age and older. °One   Wednesday Evening (Monthly: Volunteer Based).  $30 per visit, cash only  °[facility]  (919) [phone] for adults; Children under age 4, call Graduate Pediatric Dentistry at ([phone]) [phone]. Children aged 4-14, please call ([phone]) [phone] to request a pediatric application. ° Dental services are provided in all areas of dental care including fillings, crowns and bridges, complete and partial dentures, implants, gum treatment, root canals, and extractions. Preventive care is also provided. Treatment is provided to both adults and children. °Patients are selected via a lottery and there is often a waiting list. °  °[facility] [address], °[place] ° (336) 763-8833 [email or web address] °  °[facility] [address], [place], [place] (336)723-1848, Ext. 123 Second and Fourth Thursday of each month, opens at 6:30 AM; Clinic ends at 9 AM.  Patients are seen on a first-come first-served basis, and a limited number are seen during each clinic.  ° °[facility] ° [address], [place], [place] (336) 723-7904    Eligibility Requirements °You must have lived in [place], [place], or [place] counties for at least the last three months. °  You cannot be eligible for state or federal sponsored healthcare insurance, including Veterans Administration, Medicaid, or Medicare. °  You generally cannot be eligible for healthcare insurance through your employer.  °  How to apply: °Eligibility screenings are held every Tuesday and Wednesday afternoon from 1:00 pm until 4:00 pm. You do not need an appointment for the interview!  °[facility] [address], [place], [place] 336-631-2330   °[facility] [facility]  336-342-8273   °[facility]  336-703-3100   °[facility]  336-570-6415   ° °Behavioral Health Resources in the Community: °Intensive Outpatient Programs °Organization         Address  Phone  Notes  °[facility] [address], [place], [place] 336-878-6098   °[facility] [address], [place], [place] 336-832-9800   °[facility] [address], [place], [place] ° 336-882-2125   °[facility] [address],  °[place], [place] 1-800-853-5163 or 336-641-4981   °Substance Abuse Resources °Organization         Address  Phone  Notes  °[facility]  336-882-2125   °[facility]  336-784-9470   °[facility]  336-285-9073   °[facility]  336-845-3988   °Residential & Outpatient Substance Abuse Program  1-800-659-3381   °Psychological Services °Organization         Address  Phone  Notes  °[facility]  336- 832-9600   °[facility]  336- 378-7881   °[facility] [address], [place] 1-800-853-5163 or 336-641-4981   ° °Mobile Crisis Teams °Organization         Address  Phone  Notes  °[facility], Mobile Crisis Care Unit  1-877-626-1772   °Assertive °[facility] ° [address]  [place], [place] 336-834-9664   °Sharon DeEsch [address] °[place] 336-554-5454   ° °Self-Help/Support Groups °Organization         Address  Phone             Notes  °[facility] [place] [facility] - variety of support groups  336- 373-1402 Call for more information  °Narcotics Anonymous (NA), [facility] [address], °[place]  2 meetings at this location  ° °  Residential Treatment Programs °Organization         Address  Phone  Notes  °[facility] [address],    °[place]  1-866-801-8205   °[facility] ° [address], [place], [place] 704-293-8524   °[facility] [address], [place] 336-845-3988 Admissions: 8am-3pm M-F  °[facility] [address]    °[place], [place] 336-841-1104   °[facility] [address], [place], [place] 336-379-7146   °[facility] [address]  °[place], [place] 336-285-9073   °[facility] Intensive Outpatient [address], [place], [place] 336-852-3033   °[facility] [address]  °[place], [place] 1-877-615-2722 or 336-784-9470   °[facility] [address] [place], [place] 336-227-7417 Accepts Medicaid  °[facility] [address]  °[place] 1-800-659-3381 Substance Abuse/Addiction Treatment  ° °[place] Behavioral Health Resources °Organization         Address  Phone  Notes  °[facility]  (888) 581-9988   °Julie Brannon, PhD [address] [place], [place]   (336) 349-5553 or (336) 951-0000   °[facility]   [address] °[place], [place] (336) 349-4454   °[facility] [address], [place], [place] (336) 342-8316 Insurance/Medicaid/sponsorship through [facility]  °[facility] [address]                                    [place], [place] (336) 342-8316 Therapy/tele-psych/case    °[facility] [address]  ° [place], [place] (336) 349-2233    °Dr. Arfeen  (336) 349-4544   °[facility] [place]  [employer] [place] [facility] 1) [address], [place] °2) [address], [place] °3)  [address], [place] (336) 349-3220 °(336) 342-7768 ° °(336) 342-8140   °[place] [facility] (336) 342-1394 or (336) 342-3537 (After Hours)    ° ° °

## 2013-09-23 NOTE — ED Notes (Signed)
MD at bedside. 

## 2013-09-23 NOTE — ED Notes (Signed)
Patient transported to CT 

## 2013-09-23 NOTE — ED Notes (Signed)
Pt is here with abdominal pain all over, doubled over, vomiting since today.  Pt shaking at triage

## 2013-09-23 NOTE — ED Notes (Signed)
Pt returned from CT °

## 2013-09-24 LAB — COMPREHENSIVE METABOLIC PANEL
ALBUMIN: 3.9 g/dL (ref 3.5–5.2)
ALT: 20 U/L (ref 0–53)
AST: 31 U/L (ref 0–37)
Alkaline Phosphatase: 49 U/L (ref 39–117)
BUN: 13 mg/dL (ref 6–23)
CO2: 24 mEq/L (ref 19–32)
CREATININE: 0.97 mg/dL (ref 0.50–1.35)
Calcium: 9.1 mg/dL (ref 8.4–10.5)
Chloride: 102 mEq/L (ref 96–112)
GFR calc Af Amer: >90 mL/min (ref >90)
GFR calc non Af Amer: >90 mL/min (ref >90)
Glucose, Bld: 118 mg/dL — ABNORMAL HIGH (ref 70–99)
Potassium: 3.7 mEq/L (ref 3.7–5.3)
Sodium: 139 mEq/L (ref 137–147)
TOTAL PROTEIN: 7 g/dL (ref 6.0–8.3)
Total Bilirubin: 0.5 mg/dL (ref 0.3–1.2)

## 2013-09-24 LAB — CBC WITH DIFFERENTIAL/PLATELET
Basophils Absolute: 0 10*3/uL (ref 0.0–0.1)
Basophils Relative: 0 % (ref 0–1)
EOS ABS: 0 10*3/uL (ref 0.0–0.7)
Eosinophils Relative: 0 % (ref 0–5)
HCT: 39.6 % (ref 39.0–52.0)
HEMOGLOBIN: 13.4 g/dL (ref 13.0–17.0)
Lymphocytes Relative: 10 % — ABNORMAL LOW (ref 12–46)
Lymphs Abs: 1 10*3/uL (ref 0.7–4.0)
MCH: 31.5 pg (ref 26.0–34.0)
MCHC: 33.8 g/dL (ref 30.0–36.0)
MCV: 93.2 fL (ref 78.0–100.0)
MONO ABS: 0.5 10*3/uL (ref 0.1–1.0)
MONOS PCT: 5 % (ref 3–12)
NEUTROS ABS: 8.6 10*3/uL — AB (ref 1.7–7.7)
NEUTROS PCT: 85 % — AB (ref 43–77)
Platelets: 188 10*3/uL (ref 150–400)
RBC: 4.25 MIL/uL (ref 4.22–5.81)
RDW: 14 % (ref 11.5–15.5)
WBC: 10.1 10*3/uL (ref 4.0–10.5)

## 2013-09-24 LAB — LIPASE, BLOOD: LIPASE: 19 U/L (ref 11–59)

## 2013-09-24 LAB — URINE CULTURE
Colony Count: NO GROWTH
Culture: NO GROWTH
Special Requests: NORMAL

## 2013-09-24 LAB — I-STAT CG4 LACTIC ACID, ED: LACTIC ACID, VENOUS: 0.78 mmol/L (ref 0.5–2.2)

## 2013-09-24 MED ORDER — FENTANYL CITRATE 0.05 MG/ML IJ SOLN
100.0000 ug | Freq: Once | INTRAMUSCULAR | Status: AC
  Administered 2013-09-24: 100 ug via INTRAVENOUS
  Filled 2013-09-24: qty 2

## 2013-09-24 MED ORDER — OXYCODONE-ACETAMINOPHEN 5-325 MG PO TABS
2.0000 | ORAL_TABLET | Freq: Once | ORAL | Status: AC
  Administered 2013-09-24: 2 via ORAL
  Filled 2013-09-24: qty 2

## 2013-09-24 MED ORDER — ONDANSETRON HCL 4 MG/2ML IJ SOLN
4.0000 mg | Freq: Once | INTRAMUSCULAR | Status: AC
  Administered 2013-09-24: 4 mg via INTRAVENOUS
  Filled 2013-09-24: qty 2

## 2013-09-24 MED ORDER — SODIUM CHLORIDE 0.9 % IV BOLUS (SEPSIS)
1000.0000 mL | Freq: Once | INTRAVENOUS | Status: AC
  Administered 2013-09-24: 1000 mL via INTRAVENOUS

## 2013-09-24 NOTE — ED Provider Notes (Signed)
Medical screening examination/treatment/procedure(s) were performed by non-physician practitioner and as supervising physician I was immediately available for consultation/collaboration.   EKG Interpretation None        Joya Gaskins, MD 09/24/13 [phone]

## 2013-09-24 NOTE — ED Notes (Signed)
Patient stated that he was seen here last night for the same pain tonight.  Did not get the prescriptions filled for Vicodin and Phenergan filled due to finances.  Patient begging for something for pain.

## 2013-09-24 NOTE — Discharge Instructions (Signed)
Recommend a clear liquid diet until your symptoms resolve. Refrain from eating fatty foods, greasy foods, processed foods, and milk products. Take the medications prescribed to yesterday for symptom management. Followup with your primary care doctor to ensure that symptoms resolve. Return as needed if symptoms worsen.  Diet The clear liquid diet consists of foods that are liquid or will become liquid at room temperature. Examples of foods allowed on a clear liquid diet include fruit juice, broth or bouillon, gelatin, or frozen ice pops. You should be able to see through the liquid. The purpose of this diet is to provide the necessary fluids, electrolytes (such as sodium and potassium), and energy to keep the body functioning during times when you are not able to consume a regular diet. A clear liquid diet should not be continued for long periods of time, as it is not nutritionally adequate.  A CLEAR LIQUID DIET MAY BE NEEDED:  When a sudden-onset (acute) condition occurs before or after surgery.   As the first step in oral feeding.   For fluid and electrolyte replacement in diarrheal diseases.   As a diet before certain medical tests are performed.  ADEQUACY The clear liquid diet is adequate only in ascorbic acid, according to the Recommended Dietary Allowances of the [employer].  CHOOSING FOODS Breads and Starches  Allowed: None are allowed.   Avoid: All are to be avoided.  Vegetables  Allowed: Strained vegetable juices.   Avoid: Any others.  Fruit  Allowed: Strained fruit juices and fruit drinks. Include 1 serving of citrus or vitamin C-enriched fruit juice daily.   Avoid: Any others.  Meat and Meat Substitutes  Allowed: None are allowed.   Avoid: All are to be avoided.  Milk Products  Allowed: None are allowed.   Avoid: All are to be avoided.  Soups and Combination Foods  Allowed: Clear bouillon, broth, or strained  broth-based soups.   Avoid: Any others.  Desserts and Sweets  Allowed: Sugar, honey. High-protein gelatin. Flavored gelatin, ices, or frozen ice pops that do not contain milk.   Avoid: Any others.  Fats and Oils  Allowed: None are allowed.   Avoid: All are to be avoided.  Beverages  Allowed: Cereal beverages, coffee (regular or decaffeinated), tea, or soda at the discretion of your health care provider.   Avoid: Any others.  Condiments  Allowed: Salt.   Avoid: Any others, including pepper.  Supplements  Allowed: Liquid nutrition beverages that you can see through.   Avoid: Any others that contain lactose or fiber. SAMPLE MEAL PLAN Breakfast  4 oz (120 mL) strained orange juice.   to 1 cup (120 to 240 mL) gelatin (plain or fortified).  1 cup (240 mL) beverage (coffee or tea).  Sugar, if desired. Midmorning Snack   cup (120 mL) gelatin (plain or fortified). Lunch  1 cup (240 mL) broth or consomm.  4 oz (120 mL) strained grapefruit juice.   cup (120 mL) gelatin (plain or fortified).  1 cup (240 mL) beverage (coffee or tea).  Sugar, if desired. Midafternoon Snack   cup (120 mL) fruit ice.   cup (120 mL) strained fruit juice. Dinner  1 cup (240 mL) broth or consomm.   cup (120 mL) cranberry juice.   cup (120 mL) flavored gelatin (plain or fortified).  1 cup (240 mL) beverage (coffee or tea).  Sugar, if desired. Evening Snack  4 oz (120 mL) strained apple juice (vitamin C-fortified).   cup (120 mL) flavored gelatin (  plain or fortified). MAKE SURE YOU:  Understand these instructions.  Will watch your child's condition.  Will get help right away if your child is not doing well or gets worse. Document Released: 04/10/2005 Document Revised: 12/11/2012 Document Reviewed: 09/10/2012 [facility] Patient Information 2014 [place], [place].  Abdominal Pain, Adult Many things can cause belly (abdominal) pain. Most  times, the belly pain is not dangerous. Many cases of belly pain can be watched and treated at home. HOME CARE   Do not take medicines that help you go poop (laxatives) unless told to by your doctor.  Only take medicine as told by your doctor.  Eat or drink as told by your doctor. Your doctor will tell you if you should be on a special diet. GET HELP IF:  You do not know what is causing your belly pain.  You have belly pain while you are sick to your stomach (nauseous) or have runny poop (diarrhea).  You have pain while you pee or poop.  Your belly pain wakes you up at night.  You have belly pain that gets worse or better when you eat.  You have belly pain that gets worse when you eat fatty foods. GET HELP RIGHT AWAY IF:   The pain does not go away within 2 hours.  You have a fever.  You keep throwing up (vomiting).  The pain changes and is only in the right or left part of the belly.  You have bloody or tarry looking poop. MAKE SURE YOU:   Understand these instructions.  Will watch your condition.  Will get help right away if you are not doing well or get worse. Document Released: 09/27/2007 Document Revised: 01/29/2013 Document Reviewed: 12/18/2012 [facility] Patient Information 2014 [place], [place].

## 2013-09-24 NOTE — ED Provider Notes (Signed)
CSN: [id number]     Arrival date & time 09/23/13  2138 History   First MD Initiated Contact with Patient 09/23/13 2352     Chief Complaint  Patient presents with  . Emesis    (Consider location/radiation/quality/duration/timing/severity/associated sxs/prior Treatment) HPI Comments: Patient is a 33 year old male with no significant past medical history who presents to the emergency department for abdominal pain. Patient states the symptoms have been associated with vomiting and diarrhea. Symptoms began yesterday morning. Abdominal pain has been persistent and intermittent. He states the pain is diffuse and sharp in nature. Patient has had approximately 3 episodes of emesis since yesterday afternoon, all of which have been nonbloody. Patient also had a watery bowel movement free of blood yesterday. He denies associated fever, chest pain, shortness of breath, dysuria, hematuria, melena, hematochezia, and syncope.  Patient was discharged from the ED on the morning of 09/23/2013. During his hospital stay, patient had a complete workup which included blood work and CT abdomen and pelvis; only significant findings included mild leukocytosis of 11.5. Patient was discharged with Phenergan and hydrocodone, but states that he was not able to get these prescriptions filled. He has not taken any medication to manage his symptoms since his discharge. He states that he was unable to afford his medications which is why he didn't to his prescriptions.  Patient is a 33 y.o. male presenting with vomiting. The history is provided by the patient. No language interpreter was used.  Emesis Associated symptoms: abdominal pain and diarrhea     History reviewed. No pertinent past medical history. History reviewed. No pertinent past surgical history. History reviewed. No pertinent family history. History  Substance Use Topics  . Smoking status: Current Every Day Smoker    Types: Cigarettes  . Smokeless tobacco: Not on  file  . Alcohol Use: No    Review of Systems  Gastrointestinal: Positive for nausea, vomiting, abdominal pain and diarrhea.  All other systems reviewed and are negative.    Allergies  Review of patient's allergies indicates no known allergies.  Home Medications   Prior to Admission medications   Medication Sig Start Date End Date Taking? Authorizing Provider  HYDROcodone-acetaminophen (NORCO/VICODIN) 5-325 MG per tablet Take 2 tablets by mouth every 4 (four) hours as needed. 09/23/13   Vida Roller, MD  promethazine (PHENERGAN) 25 MG tablet Take 1 tablet (25 mg total) by mouth every 6 (six) hours as needed for nausea or vomiting. 09/23/13   Vida Roller, MD   BP 137/99  Pulse 78  Temp(Src) 98.4 F (36.9 C)  Resp 18  Physical Exam  Nursing note and vitals reviewed. Constitutional: He is oriented to person, place, and time. He appears well-developed and well-nourished. No distress.  Nontoxic/nonseptic appearing.  HENT:  Head: Normocephalic and atraumatic.  Mouth/Throat: Oropharynx is clear and moist. No oropharyngeal exudate.  Eyes: Conjunctivae and EOM are normal. Pupils are equal, round, and reactive to light. No scleral icterus.  Neck: Normal range of motion. Neck supple.  Cardiovascular: Normal rate, regular rhythm and normal heart sounds.   Pulmonary/Chest: Effort normal and breath sounds normal. No respiratory distress. He has no wheezes. He has no rales.  Abdominal: Soft. He exhibits no distension and no mass. There is tenderness (diffuse, mild). There is no rebound and no guarding.  Soft abdomen with mild diffuse tenderness. No focal tenderness. No peritoneal signs.  Musculoskeletal: Normal range of motion.  Neurological: He is alert and oriented to person, place, and time.  GCS 15.  Speech is goal oriented. Patient moves extremities without ataxia.  Skin: Skin is warm and dry. No rash noted. He is not diaphoretic. No erythema. No pallor.  Psychiatric: He has a normal  mood and affect. His behavior is normal.    ED Course  Procedures (including critical care time) Labs Review Labs Reviewed  CBC WITH DIFFERENTIAL - Abnormal; Notable for the following:    Neutrophils Relative % 85 (*)    Neutro Abs 8.6 (*)    Lymphocytes Relative 10 (*)    All other components within normal limits  COMPREHENSIVE METABOLIC PANEL - Abnormal; Notable for the following:    Glucose, Bld 118 (*)    All other components within normal limits  LIPASE, BLOOD  I-STAT CG4 LACTIC ACID, ED    Imaging Review Ct Abdomen Pelvis W Contrast  09/23/2013   CLINICAL DATA:  Abdominal pain and emesis.  EXAM: CT ABDOMEN AND PELVIS WITH CONTRAST  TECHNIQUE: Multidetector CT imaging of the abdomen and pelvis was performed using the standard protocol following bolus administration of intravenous contrast.  CONTRAST:  OMNIPAQUE IOHEXOL 300 MG/ML  SOLN  COMPARISON:  CT of the abdomen and pelvis April 04, 2012.  FINDINGS: Included view of the lung bases are clear. Visualized heart and pericardium are unremarkable.  The spleen, gallbladder, pancreas and adrenal glands are unremarkable. 19 mm low-density lesion in the central liver showed some centripetal enhancement of prior examination and likely reflects hemangioma.  The stomach, small and large bowel are normal in course and caliber without inflammatory changes. Enteric contrast has not yet reached the distal small bowel. The appendix is not discretely identified, however there are no inflammatory changes in the right lower quadrant. Small amount of free fluid in the pelvis. No a pneumoperitoneum.  Kidneys are orthotopic, demonstrating symmetric enhancement without nephrolithiasis, hydronephrosis or renal masses. The unopacified ureters are normal in course and caliber. Delayed imaging through the kidneys demonstrates symmetric prompt excretion to the proximal urinary collecting system. Urinary bladder is partially distended and unremarkable.   Aortoiliac vessels are normal in course and caliber. Prostate is 5 cm in transaxial dimension. The soft tissues and included osseous structures are nonsuspicious.  IMPRESSION: Small amount of free fluid in the pelvis, limited assessment for bowel pathology as contrast has yet to reach the distal small bowel and, overall paucity of intra-abdominal fat. Stable prostatomegaly, unclear if there could be a component of prostatitis.   Electronically Signed   By: Awilda Metro   On: 09/23/2013 06:39     EKG Interpretation None      MDM   Final diagnoses:  Abdominal pain  Vomiting and diarrhea    33 year old male presents to the emergency department for persistent abdominal pain, vomiting, and diarrhea. No hematemesis, melena, hematochezia, or fever. Patient was seen and evaluated in ED on 09/23/2013 with an unremarkable laboratory workup as well as unremarkable CT scan. Patient states he has not taken the medicines prescribed to him at this discharge because he was unable to afford them.   Patient today as well and nontoxic appearing, hemodynamically stable, and afebrile. Patient has had an episode of diarrhea today as well as 3 episodes of nonbloody emesis. Initial abdominal exam elicits soft abdomen without masses or peritoneal signs. There is mild diffuse tenderness appreciated on deep palpation only. Labs obtained. Leukocytosis improving, H/H stable, and there is no electrolyte imbalance. Liver and kidney function preserved. Lipase WNL. Lactate normal.  Patient treated in ED with fentanyl, antiemetics, and IV  fluids. Patient has not had any episode of vomiting since arrival in exam room. He states his pain has improved. Abdominal reexamination improved. Abdomen remains soft without rebound, guarding, or peritoneal signs. He is tolerating POs without emesis. Patient stable and appropriate for discharge with instruction to take the pain medicine and nausea medicine prescribed to him yesterday.  Have advised a clear liquid diet as well as primary care followup. Return precautions provided and discussed. Patient agreeable to plan with no unaddressed concerns.   Filed Vitals:   09/23/13 2144 09/24/13 0225  BP: 137/99 127/59  Pulse: 78 50  Temp: 98.4 F (36.9 C) 98.4 F (36.9 C)  TempSrc:  Oral  Resp: 18 18  SpO2:  100%     Antony MaduraKelly Clell Trahan, PA-C 09/24/13 0246

## 2013-09-24 NOTE — ED Notes (Signed)
Cranberry juice given for fluid challenge

## 2014-02-12 ENCOUNTER — Emergency Department (HOSPITAL_COMMUNITY)
Admission: EM | Admit: 2014-02-12 | Discharge: 2014-02-12 | Disposition: A | Discharge status: Home / Self Care | Payer: No Typology Code available for payment source | Attending: Emergency Medicine | Admitting: Emergency Medicine

## 2014-02-12 ENCOUNTER — Encounter (HOSPITAL_COMMUNITY): Payer: Self-pay | Admitting: Emergency Medicine

## 2014-02-12 DIAGNOSIS — R1084 Generalized abdominal pain: Secondary | ICD-10-CM | POA: Diagnosis present

## 2014-02-12 DIAGNOSIS — Z79899 Other long term (current) drug therapy: Secondary | ICD-10-CM | POA: Insufficient documentation

## 2014-02-12 DIAGNOSIS — Z87891 Personal history of nicotine dependence: Secondary | ICD-10-CM | POA: Diagnosis not present

## 2014-02-12 DIAGNOSIS — K2970 Gastritis, unspecified, without bleeding: Secondary | ICD-10-CM | POA: Insufficient documentation

## 2014-02-12 LAB — CBC WITH DIFFERENTIAL/PLATELET
Basophils Absolute: 0 10*3/uL (ref 0.0–0.1)
Basophils Relative: 0 % (ref 0–1)
EOS ABS: 0 10*3/uL (ref 0.0–0.7)
Eosinophils Relative: 0 % (ref 0–5)
HEMATOCRIT: 40.8 % (ref 39.0–52.0)
Hemoglobin: 13.5 g/dL (ref 13.0–17.0)
LYMPHS ABS: 0.8 10*3/uL (ref 0.7–4.0)
LYMPHS PCT: 9 % — AB (ref 12–46)
MCH: 30.5 pg (ref 26.0–34.0)
MCHC: 33.1 g/dL (ref 30.0–36.0)
MCV: 92.1 fL (ref 78.0–100.0)
MONO ABS: 0.3 10*3/uL (ref 0.1–1.0)
Monocytes Relative: 3 % (ref 3–12)
Neutro Abs: 8.8 10*3/uL — ABNORMAL HIGH (ref 1.7–7.7)
Neutrophils Relative %: 88 % — ABNORMAL HIGH (ref 43–77)
PLATELETS: 172 10*3/uL (ref 150–400)
RBC: 4.43 MIL/uL (ref 4.22–5.81)
RDW: 12.8 % (ref 11.5–15.5)
WBC: 9.9 10*3/uL (ref 4.0–10.5)

## 2014-02-12 LAB — URINE MICROSCOPIC-ADD ON

## 2014-02-12 LAB — COMPREHENSIVE METABOLIC PANEL WITH GFR
ALT: 23 U/L (ref 0–53)
AST: 42 U/L — ABNORMAL HIGH (ref 0–37)
Albumin: 3.9 g/dL (ref 3.5–5.2)
Alkaline Phosphatase: 44 U/L (ref 39–117)
Anion gap: 10 (ref 5–15)
BUN: 12 mg/dL (ref 6–23)
CO2: 26 meq/L (ref 19–32)
Calcium: 8.9 mg/dL (ref 8.4–10.5)
Chloride: 103 meq/L (ref 96–112)
Creatinine, Ser: 1.06 mg/dL (ref 0.50–1.35)
GFR calc Af Amer: >90 mL/min
GFR calc non Af Amer: >90 mL/min
Glucose, Bld: 111 mg/dL — ABNORMAL HIGH (ref 70–99)
Potassium: 3.8 meq/L (ref 3.7–5.3)
Sodium: 139 meq/L (ref 137–147)
Total Bilirubin: 0.3 mg/dL (ref 0.3–1.2)
Total Protein: 7.1 g/dL (ref 6.0–8.3)

## 2014-02-12 LAB — URINALYSIS, ROUTINE W REFLEX MICROSCOPIC
Glucose, UA: NEGATIVE mg/dL
Ketones, ur: 15 mg/dL — AB
Leukocytes, UA: NEGATIVE
Nitrite: NEGATIVE
Protein, ur: 30 mg/dL — AB
Specific Gravity, Urine: 1.04 — ABNORMAL HIGH (ref 1.005–1.030)
Urobilinogen, UA: 0.2 mg/dL (ref 0.0–1.0)
pH: 5.5 (ref 5.0–8.0)

## 2014-02-12 LAB — LIPASE, BLOOD: LIPASE: 15 U/L (ref 11–59)

## 2014-02-12 MED ORDER — MORPHINE SULFATE 2 MG/ML IJ SOLN: 4.0000 mg | Freq: Once | INTRAMUSCULAR | Status: DC

## 2014-02-12 MED ORDER — SODIUM CHLORIDE 0.9 % IV SOLN
1000.0000 mL | INTRAVENOUS | Status: DC
  Administered 2014-02-12: 1000 mL via INTRAVENOUS

## 2014-02-12 MED ORDER — ONDANSETRON HCL 4 MG/2ML IJ SOLN: 4.0000 mg | Freq: Once | INTRAMUSCULAR | Status: DC

## 2014-02-12 MED ORDER — ONDANSETRON HCL 4 MG/2ML IJ SOLN
4.0000 mg | Freq: Once | INTRAMUSCULAR | Status: AC
  Administered 2014-02-12: 4 mg via INTRAVENOUS
  Filled 2014-02-12: qty 2

## 2014-02-12 MED ORDER — ONDANSETRON HCL 4 MG PO TABS: 4.0000 mg | ORAL_TABLET | Freq: Four times a day (QID) | ORAL | Status: DC

## 2014-02-12 MED ORDER — SODIUM CHLORIDE 0.9 % IV SOLN
1000.0000 mL | Freq: Once | INTRAVENOUS | Status: AC
  Administered 2014-02-12: 1000 mL via INTRAVENOUS

## 2014-02-12 MED ORDER — HYDROMORPHONE HCL 1 MG/ML IJ SOLN
1.0000 mg | Freq: Once | INTRAMUSCULAR | Status: AC
  Administered 2014-02-12: 1 mg via INTRAVENOUS
  Filled 2014-02-12: qty 1

## 2014-02-12 MED ORDER — HYDROCODONE-ACETAMINOPHEN 5-325 MG PO TABS: 1.0000 | ORAL_TABLET | ORAL | Status: DC | PRN

## 2014-02-12 NOTE — ED Provider Notes (Signed)
Medical screening examination/treatment/procedure(s) were conducted as a shared visit with non-physician practitioner(s) and myself.  I personally evaluated the patient during the encounter.   EKG Interpretation None      Patient feels much better at this time.  Abdominal exam is benign.  Likely viral process.  No indication for imaging.  Lyanne CoKevin M Jamarien Rodkey, MD 02/12/14 [phone]

## 2014-02-12 NOTE — Discharge Instructions (Signed)

## 2014-02-12 NOTE — ED Notes (Signed)
C/o abd. Pain with nausea and vomiting onset last pm. Denies diarrhea

## 2014-02-12 NOTE — ED Provider Notes (Signed)
CSN: [id number]     Arrival date & time 02/12/14  [id number] History   First MD Initiated Contact with Patient 02/12/14 1010     Chief Complaint  Patient presents with  . Abdominal Pain     (Consider location/radiation/quality/duration/timing/severity/associated sxs/prior Treatment) HPI   Patient to the ER with complaints of diffuse severe abdominal pains and retching. He developed the symptoms last night after work and this morning after only having a cup of coffee and going to work his pain worsened. He is unable to localize the pain. He has not had any hx of abdominal pains or intra-abdominal surgeries. He has not had any urinary or bowel symptoms. No blood in vomit. Denies coughing, nasal congestion, sore throat, ear pain, myalgias. He feels that he has the stomach flu, said this happened at the same time last year.    No past medical history on file. No past surgical history on file. No family history on file. History  Substance Use Topics  . Smoking status: Former Smoker    Types: Cigarettes    Quit date: 11/12/2013  . Smokeless tobacco: Not on file  . Alcohol Use: No    Review of Systems  All other systems reviewed and are negative.     Allergies  Review of patient's allergies indicates no known allergies.  Home Medications   Prior to Admission medications   Medication Sig Start Date End Date Taking? Authorizing Provider  HYDROcodone-acetaminophen (NORCO/VICODIN) 5-325 MG per tablet Take 1-2 tablets by mouth every 4 (four) hours as needed. 02/12/14   Taina Landry Irine SealG Dawsen Krieger, PA-C  ondansetron (ZOFRAN) 4 MG tablet Take 1 tablet (4 mg total) by mouth every 6 (six) hours. 02/12/14   Gaylene Moylan Irine SealG Jacarra Bobak, PA-C   BP 127/75  Pulse 53  Temp(Src) 97.9 F (36.6 C) (Oral)  Resp 18  Ht 6\' 4"  (1.93 m)  Wt 195 lb (88.451 kg)  BMI 23.75 kg/m2  SpO2 100% Physical Exam  Nursing note and vitals reviewed. Constitutional: He appears well-developed and well-nourished. No distress.  HENT:   Head: Normocephalic and atraumatic.  Eyes: Pupils are equal, round, and reactive to light.  Neck: Normal range of motion. Neck supple.  Cardiovascular: Normal rate and regular rhythm.   Pulmonary/Chest: Effort normal.  Abdominal: Soft.  Tenderness is diffuse and mild. Pt retching on exam  Neurological: He is alert.  Skin: Skin is warm and dry.    ED Course  Procedures (including critical care time) Labs Review Labs Reviewed  CBC WITH DIFFERENTIAL - Abnormal; Notable for the following:    Neutrophils Relative % 88 (*)    Neutro Abs 8.8 (*)    Lymphocytes Relative 9 (*)    All other components within normal limits  URINALYSIS, ROUTINE W REFLEX MICROSCOPIC - Abnormal; Notable for the following:    Color, Urine AMBER (*)    Specific Gravity, Urine 1.040 (*)    Hgb urine dipstick SMALL (*)    Bilirubin Urine SMALL (*)    Ketones, ur 15 (*)    Protein, ur 30 (*)    All other components within normal limits  COMPREHENSIVE METABOLIC PANEL - Abnormal; Notable for the following:    Glucose, Bld 111 (*)    AST 42 (*)    All other components within normal limits  URINE MICROSCOPIC-ADD ON - Abnormal; Notable for the following:    Squamous Epithelial / LPF FEW (*)    Bacteria, UA FEW (*)    All other components within normal limits  LIPASE, BLOOD    Imaging Review No results found.   EKG Interpretation None      MDM   Final diagnoses:  Gastritis    Medications  0.9 %  sodium chloride infusion (0 mLs Intravenous Stopped 02/12/14 1200)    Followed by  0.9 %  sodium chloride infusion (1,000 mLs Intravenous New Bag/Given 02/12/14 1226)  HYDROmorphone (DILAUDID) injection 1 mg (1 mg Intravenous Given 02/12/14 1112)  ondansetron (ZOFRAN) injection 4 mg (4 mg Intravenous Given 02/12/14 1112)   Patient has significant improvements of symptoms after fluids and pain medications/nausea medications. He is feeling much better and requesting PO. Tolerated PO. His labs are  unremarkable. Dr. Patria Maneampos has seen him as well and feels that he is safe to discharge at this time. Just prior to discharge, abdomen palpated and he has no tenderness.  Will rx Vicodin and zofran for home.  32 y.o.Corey Atkinson's evaluation in the Emergency Department is complete. It has been determined that no acute conditions requiring further emergency intervention are present at this time. The patient/guardian have been advised of the diagnosis and plan. We have discussed signs and symptoms that warrant return to the ED, such as changes or worsening in symptoms.  Vital signs are stable at discharge. Filed Vitals:   02/12/14 1215  BP: 127/75  Pulse: 53  Temp:   Resp: 18    Patient/guardian has voiced understanding and agreed to follow-up with the PCP or specialist.     Dorthula Matasiffany G Steven Basso, PA-C 02/12/14 1301

## 2014-02-12 NOTE — ED Notes (Signed)
Pt reports yesterday started having pain all over his stomach after work. Then reports began vomiting multiple time. Denies blood in stool or vomit. Currently reports weakness and pain.

## 2014-06-30 ENCOUNTER — Encounter (HOSPITAL_COMMUNITY): Payer: Self-pay | Admitting: Emergency Medicine

## 2014-06-30 ENCOUNTER — Emergency Department (HOSPITAL_COMMUNITY): Payer: No Typology Code available for payment source

## 2014-06-30 ENCOUNTER — Emergency Department (HOSPITAL_COMMUNITY)
Admission: EM | Admit: 2014-06-30 | Discharge: 2014-06-30 | Disposition: A | Discharge status: Home / Self Care | Payer: No Typology Code available for payment source | Attending: Emergency Medicine | Admitting: Emergency Medicine

## 2014-06-30 DIAGNOSIS — Z87891 Personal history of nicotine dependence: Secondary | ICD-10-CM | POA: Insufficient documentation

## 2014-06-30 DIAGNOSIS — L089 Local infection of the skin and subcutaneous tissue, unspecified: Secondary | ICD-10-CM | POA: Insufficient documentation

## 2014-06-30 DIAGNOSIS — Z87828 Personal history of other (healed) physical injury and trauma: Secondary | ICD-10-CM | POA: Diagnosis not present

## 2014-06-30 DIAGNOSIS — M7989 Other specified soft tissue disorders: Secondary | ICD-10-CM | POA: Diagnosis present

## 2014-06-30 LAB — CBC WITH DIFFERENTIAL/PLATELET
BASOS PCT: 1 % (ref 0–1)
Basophils Absolute: 0 10*3/uL (ref 0.0–0.1)
EOS ABS: 0.2 10*3/uL (ref 0.0–0.7)
EOS PCT: 2 % (ref 0–5)
HCT: 40.9 % (ref 39.0–52.0)
HEMOGLOBIN: 14.3 g/dL (ref 13.0–17.0)
LYMPHS ABS: 2.7 10*3/uL (ref 0.7–4.0)
Lymphocytes Relative: 33 % (ref 12–46)
MCH: 31.9 pg (ref 26.0–34.0)
MCHC: 35 g/dL (ref 30.0–36.0)
MCV: 91.3 fL (ref 78.0–100.0)
MONO ABS: 0.4 10*3/uL (ref 0.1–1.0)
MONOS PCT: 5 % (ref 3–12)
NEUTROS ABS: 4.8 10*3/uL (ref 1.7–7.7)
NEUTROS PCT: 59 % (ref 43–77)
PLATELETS: 209 10*3/uL (ref 150–400)
RBC: 4.48 MIL/uL (ref 4.22–5.81)
RDW: 13.3 % (ref 11.5–15.5)
WBC: 8.1 10*3/uL (ref 4.0–10.5)

## 2014-06-30 LAB — COMPREHENSIVE METABOLIC PANEL
ALT: 22 U/L (ref 0–53)
ANION GAP: 9 (ref 5–15)
AST: 34 U/L (ref 0–37)
Albumin: 3.7 g/dL (ref 3.5–5.2)
Alkaline Phosphatase: 51 U/L (ref 39–117)
BILIRUBIN TOTAL: 0.6 mg/dL (ref 0.3–1.2)
BUN: 13 mg/dL (ref 6–23)
CHLORIDE: 106 mmol/L (ref 96–112)
CO2: 24 mmol/L (ref 19–32)
Calcium: 9.3 mg/dL (ref 8.4–10.5)
Creatinine, Ser: 1.22 mg/dL (ref 0.50–1.35)
GFR, EST AFRICAN AMERICAN: 89 mL/min — AB (ref >90)
GFR, EST NON AFRICAN AMERICAN: 77 mL/min — AB (ref >90)
GLUCOSE: 94 mg/dL (ref 70–99)
Potassium: 4 mmol/L (ref 3.5–5.1)
SODIUM: 139 mmol/L (ref 135–145)
Total Protein: 6.5 g/dL (ref 6.0–8.3)

## 2014-06-30 MED ORDER — OXYCODONE-ACETAMINOPHEN 5-325 MG PO TABS
2.0000 | ORAL_TABLET | Freq: Once | ORAL | Status: AC
  Administered 2014-06-30: 2 via ORAL
  Filled 2014-06-30: qty 2

## 2014-06-30 MED ORDER — AMOXICILLIN-POT CLAVULANATE 875-125 MG PO TABS
1.0000 | ORAL_TABLET | Freq: Once | ORAL | Status: AC
  Administered 2014-06-30: 1 via ORAL
  Filled 2014-06-30: qty 1

## 2014-06-30 MED ORDER — OXYCODONE-ACETAMINOPHEN 5-325 MG PO TABS
2.0000 | ORAL_TABLET | ORAL | Status: DC | PRN
Start: 2014-06-30 — End: 2018-03-10

## 2014-06-30 MED ORDER — IBUPROFEN 400 MG PO TABS
800.0000 mg | ORAL_TABLET | ORAL | Status: AC
  Administered 2014-06-30: 800 mg via ORAL
  Filled 2014-06-30: qty 2

## 2014-06-30 MED ORDER — AMOXICILLIN-POT CLAVULANATE 875-125 MG PO TABS: 1.0000 | ORAL_TABLET | Freq: Two times a day (BID) | ORAL | Status: DC

## 2014-06-30 NOTE — Discharge Instructions (Signed)
Wound Infection °A wound infection happens when a type of germ (bacteria) starts growing in the wound. In some cases, this can cause the wound to break open. If cared for properly, the infected wound will heal from the inside to the outside. Wound infections need treatment. °CAUSES °An infection is caused by bacteria growing in the wound.  °SYMPTOMS  °· Increase in redness, swelling, or pain at the wound site. °· Increase in drainage at the wound site. °· Wound or bandage (dressing) starts to smell bad. °· Fever. °· Feeling tired or fatigued. °· Pus draining from the wound. °TREATMENT  °Your health care provider will prescribe antibiotic medicine. The wound infection should improve within 24 to 48 hours. Any redness around the wound should stop spreading and the wound should be less painful.  °HOME CARE INSTRUCTIONS  °· Only take over-the-counter or prescription medicines for pain, discomfort, or fever as directed by your health care provider. °· Take your antibiotics as directed. Finish them even if you start to feel better. °· Gently wash the area with mild soap and water 2 times a day, or as directed. Rinse off the soap. Pat the area dry with a clean towel. Do not rub the wound. This may cause bleeding. °· Follow your health care provider's instructions for how often you need to change the dressing. °· Apply ointment and a dressing to the wound as directed. °· If the dressing sticks, moisten it with soapy water and gently remove it. °· Change the bandage right away if it becomes wet, dirty, or develops a bad smell. °· Take showers. Do not take tub baths, swim, or do anything that may soak the wound until it is healed. °· Avoid exercises that make you sweat heavily. °· Use anti-itch medicine as directed by your health care provider. The wound may itch when it is healing. Do not pick or scratch at the wound. °· Follow up with your health care provider to get your wound rechecked as directed. °SEEK MEDICAL CARE  IF: °· You have an increase in swelling, pain, or redness around the wound. °· You have an increase in the amount of pus coming from the wound. °· There is a bad smell coming from the wound. °· More of the wound breaks open. °· You have a fever. °MAKE SURE YOU:  °· Understand these instructions. °· Will watch your condition. °· Will get help right away if you are not doing well or get worse. °Document Released: 01/07/2003 Document Revised: 04/15/2013 Document Reviewed: 08/14/2010 °[employer]® Patient Information ©2015 [employer]. This information is not intended to replace advice given to you by your health care provider. Make sure you discuss any questions you have with your health care provider. ° °
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
## 2014-07-01 LAB — SEDIMENTATION RATE: Sed Rate: 1 mm/hr (ref 0–16)

## 2014-07-01 LAB — C-REACTIVE PROTEIN: CRP: <0.5 mg/dL — ABNORMAL LOW (ref <0.60)

## 2018-01-12 ENCOUNTER — Other Ambulatory Visit: Payer: Self-pay

## 2018-01-12 ENCOUNTER — Emergency Department (HOSPITAL_COMMUNITY)
Admission: EM | Admit: 2018-01-12 | Discharge: 2018-01-12 | Disposition: A | Discharge status: Home / Self Care | Payer: Self-pay | Attending: Emergency Medicine | Admitting: Emergency Medicine

## 2018-01-12 ENCOUNTER — Encounter (HOSPITAL_COMMUNITY): Payer: Self-pay | Admitting: Emergency Medicine

## 2018-01-12 DIAGNOSIS — Z79899 Other long term (current) drug therapy: Secondary | ICD-10-CM | POA: Insufficient documentation

## 2018-01-12 DIAGNOSIS — R1084 Generalized abdominal pain: Secondary | ICD-10-CM | POA: Insufficient documentation

## 2018-01-12 DIAGNOSIS — Z87891 Personal history of nicotine dependence: Secondary | ICD-10-CM | POA: Insufficient documentation

## 2018-01-12 DIAGNOSIS — R112 Nausea with vomiting, unspecified: Secondary | ICD-10-CM | POA: Insufficient documentation

## 2018-01-12 LAB — CBC
HCT: 43.8 % (ref 39.0–52.0)
Hemoglobin: 13.6 g/dL (ref 13.0–17.0)
MCH: 30.5 pg (ref 26.0–34.0)
MCHC: 31.1 g/dL (ref 30.0–36.0)
MCV: 98.2 fL (ref 78.0–100.0)
PLATELETS: 190 10*3/uL (ref 150–400)
RBC: 4.46 MIL/uL (ref 4.22–5.81)
RDW: 13.8 % (ref 11.5–15.5)
WBC: 14 10*3/uL — ABNORMAL HIGH (ref 4.0–10.5)

## 2018-01-12 LAB — URINALYSIS, ROUTINE W REFLEX MICROSCOPIC
Bilirubin Urine: NEGATIVE
GLUCOSE, UA: NEGATIVE mg/dL
Hgb urine dipstick: NEGATIVE
Ketones, ur: 20 mg/dL — AB
Leukocytes, UA: NEGATIVE
Nitrite: NEGATIVE
PH: 7 (ref 5.0–8.0)
Protein, ur: 30 mg/dL — AB
Specific Gravity, Urine: 1.029 (ref 1.005–1.030)

## 2018-01-12 LAB — COMPREHENSIVE METABOLIC PANEL
ALK PHOS: 51 U/L (ref 38–126)
ALT: 21 U/L (ref 0–44)
AST: 31 U/L (ref 15–41)
Albumin: 4.3 g/dL (ref 3.5–5.0)
Anion gap: 11 (ref 5–15)
BUN: 12 mg/dL (ref 6–20)
CALCIUM: 9.3 mg/dL (ref 8.9–10.3)
CO2: 20 mmol/L — AB (ref 22–32)
CREATININE: 1.19 mg/dL (ref 0.61–1.24)
Chloride: 112 mmol/L — ABNORMAL HIGH (ref 98–111)
GFR calc non Af Amer: >60 mL/min (ref >60)
GLUCOSE: 109 mg/dL — AB (ref 70–99)
Potassium: 4.1 mmol/L (ref 3.5–5.1)
SODIUM: 143 mmol/L (ref 135–145)
Total Bilirubin: 1 mg/dL (ref 0.3–1.2)
Total Protein: 7.1 g/dL (ref 6.5–8.1)

## 2018-01-12 LAB — LIPASE, BLOOD: Lipase: 20 U/L (ref 11–51)

## 2018-01-12 MED ORDER — ONDANSETRON HCL 4 MG/2ML IJ SOLN
4.0000 mg | Freq: Once | INTRAMUSCULAR | Status: AC
  Administered 2018-01-12: 4 mg via INTRAVENOUS
  Filled 2018-01-12: qty 2

## 2018-01-12 MED ORDER — DICYCLOMINE HCL 20 MG PO TABS: 20.0000 mg | ORAL_TABLET | Freq: Two times a day (BID) | ORAL | 0 refills | Status: DC

## 2018-01-12 MED ORDER — SODIUM CHLORIDE 0.9 % IV BOLUS
1000.0000 mL | Freq: Once | INTRAVENOUS | Status: AC
  Administered 2018-01-12: 1000 mL via INTRAVENOUS

## 2018-01-12 MED ORDER — KETOROLAC TROMETHAMINE 15 MG/ML IJ SOLN
15.0000 mg | Freq: Once | INTRAMUSCULAR | Status: AC
  Administered 2018-01-12: 15 mg via INTRAVENOUS
  Filled 2018-01-12: qty 1

## 2018-01-12 MED ORDER — ONDANSETRON 4 MG PO TBDP
4.0000 mg | ORAL_TABLET | Freq: Once | ORAL | Status: AC | PRN
  Administered 2018-01-12: 4 mg via ORAL
  Filled 2018-01-12: qty 1

## 2018-01-12 MED ORDER — DICYCLOMINE HCL 10 MG/ML IM SOLN
20.0000 mg | Freq: Once | INTRAMUSCULAR | Status: AC
  Administered 2018-01-12: 20 mg via INTRAMUSCULAR
  Filled 2018-01-12: qty 2

## 2018-01-12 MED ORDER — ONDANSETRON 4 MG PO TBDP: 4.0000 mg | ORAL_TABLET | Freq: Three times a day (TID) | ORAL | 0 refills | Status: DC | PRN

## 2018-01-12 MED ORDER — HALOPERIDOL LACTATE 5 MG/ML IJ SOLN
4.0000 mg | Freq: Once | INTRAMUSCULAR | Status: AC
  Administered 2018-01-12: 4 mg via INTRAVENOUS
  Filled 2018-01-12: qty 1

## 2018-01-12 NOTE — ED Triage Notes (Signed)
Pt states "I need an IV for fluid and pain meds." States woke up this morning with emesis and generalized abdominal pain, all over. Denies diarrhea. diaphoretic and actively vomiting.

## 2018-01-12 NOTE — ED Provider Notes (Signed)
[facility] Provider Note   CSN: [id number] Arrival date & time: 01/12/18  1533     History   Chief Complaint Chief Complaint  Patient presents with  . Abdominal Pain  . Emesis    HPI Corey Atkinson is a 37 y.o. male.  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 Has generalized abdominal pain. No fevers. 2 episodes of diarrhea, not black or bloody  History reviewed. No pertinent past medical history.  There are no active problems to display for this patient.   No past surgical history on file.      Home Medications    Prior to Admission medications   Medication Sig Start Date End Date Taking? Authorizing Provider  dicyclomine (BENTYL) 20 MG tablet Take 1 tablet (20 mg total) by mouth 2 (two) times daily. 01/12/18   Alvira MondaySchlossman, Nathan Moctezuma, MD  HYDROcodone-acetaminophen (NORCO/VICODIN) 5-325 MG per tablet Take 1-2 tablets by mouth every 4 (four) hours as needed. 02/12/14   Marlon PelGreene, Tiffany, PA-C  ondansetron (ZOFRAN ODT) 4 MG disintegrating tablet Take 1 tablet (4 mg total) by mouth every 8 (eight) hours as needed for nausea or vomiting. 01/12/18   Alvira MondaySchlossman, Da Michelle, MD  ondansetron (ZOFRAN) 4 MG tablet Take 1 tablet (4 mg total) by mouth every 6 (six) hours. 02/12/14   Marlon PelGreene, Tiffany, PA-C  oxyCODONE-acetaminophen (PERCOCET/ROXICET) 5-325 MG per tablet Take 2 tablets by mouth every 4 (four) hours as needed for severe pain. Patient not taking: Reported on 01/12/2018 06/30/14   Osie CheeksSofia, Leslie K, PA-C    Family History No family history on file.  Social History Social History   Tobacco Use  . Smoking status: Former Smoker    Types: Cigarettes    Last attempt to quit: 11/12/2013    Years since quitting: 4.1  Substance Use Topics  . Alcohol use: No  . Drug use: No     Allergies     Patient has no known allergies.   Review of Systems Review of Systems  Constitutional: Negative for fever.  HENT: Negative for sore throat.   Eyes: Negative for visual disturbance.  Respiratory: Negative for shortness of breath.   Cardiovascular: Negative for chest pain.  Gastrointestinal: Positive for abdominal pain, nausea and vomiting.  Genitourinary: Negative for difficulty urinating and dysuria.  Musculoskeletal: Negative for back pain and neck stiffness.  Skin: Negative for rash.  Neurological: Negative for syncope and headaches.     Physical Exam Updated Vital Signs BP (!) 113/56   Pulse (!) 58   Temp 99.1 F (37.3 C) (Oral)   Resp 12   SpO2 96%   Physical Exam  Constitutional: He is oriented to person, place, and time. He appears well-developed and well-nourished. No distress.  HENT:  Head: Normocephalic and atraumatic.  Eyes: Conjunctivae and EOM are normal.  Neck: Normal range of motion.  Cardiovascular: Normal rate, regular rhythm, normal heart sounds and intact distal pulses. Exam reveals no gallop and no friction rub.  No murmur heard. Pulmonary/Chest: Effort normal and breath sounds normal. No respiratory distress. He has no wheezes. He has no rales.  Abdominal: Soft. He exhibits no distension. There is tenderness (mild diffuse). There is no guarding.  Musculoskeletal: He exhibits no edema.  Neurological: He is alert and oriented to person, place, and time.  Skin: Skin is warm  and dry. He is not diaphoretic.  Nursing note and vitals reviewed.    ED Treatments / Results  Labs (all labs ordered are listed, but only abnormal results are displayed) Labs Reviewed  COMPREHENSIVE METABOLIC PANEL - Abnormal; Notable for the following components:      Result Value   Chloride 112 (*)    CO2 20 (*)    Glucose, Bld 109 (*)    All other components within normal limits  CBC - Abnormal; Notable for the following components:   WBC 14.0 (*)    All other  components within normal limits  URINALYSIS, ROUTINE W REFLEX MICROSCOPIC - Abnormal; Notable for the following components:   Ketones, ur 20 (*)    Protein, ur 30 (*)    Bacteria, UA RARE (*)    All other components within normal limits  LIPASE, BLOOD    EKG None  Radiology No results found.  Procedures Procedures (including critical care time)  Medications Ordered in ED Medications  ondansetron (ZOFRAN-ODT) disintegrating tablet 4 mg (4 mg Oral Given 01/12/18 1601)  sodium chloride 0.9 % bolus 1,000 mL (0 mLs Intravenous Stopped 01/12/18 1959)  ondansetron (ZOFRAN) injection 4 mg (4 mg Intravenous Given 01/12/18 1736)  haloperidol lactate (HALDOL) injection 4 mg (4 mg Intravenous Given 01/12/18 1823)  dicyclomine (BENTYL) injection 20 mg (20 mg Intramuscular Given 01/12/18 1823)  ketorolac (TORADOL) 15 MG/ML injection 15 mg (15 mg Intravenous Given 01/12/18 1823)  sodium chloride 0.9 % bolus 1,000 mL (0 mLs Intravenous Stopped 01/12/18 2303)     Initial Impression / Assessment and Plan / ED Course  I have reviewed the triage vital signs and the nursing notes.  Pertinent labs & imaging results that were available during my care of the patient were reviewed by me and considered in my medical decision making (see chart for details).     37 year old male presents with concern for nausea and vomiting beginning this morning.  Abdominal exam is benign, nonfocal, have low suspicion for appendicitis, cholecystitis, diverticulitis.  No signs of small bowel obstruction by history or exam.  No significant electrolyte abnormalities.  No sign of UTI.  Patient has had similar episodes in the past.  Suspect likely cannabinoid hyperemesis, however viral etiology of vomiting and diarrhea is also the differential.  Given zofran, haldol, bentyl with significant improvement.  Given rx for zofran/bentyl, recommend avoidance of marijuana. Patient discharged in stable condition with understanding of  reasons to return.   Final Clinical Impressions(s) / ED Diagnoses   Final diagnoses:  Generalized abdominal pain  Non-intractable vomiting with nausea, unspecified vomiting type    ED Discharge Orders         Ordered    ondansetron (ZOFRAN ODT) 4 MG disintegrating tablet  Every 8 hours PRN     01/12/18 2243    dicyclomine (BENTYL) 20 MG tablet  2 times daily     01/12/18 2243           Alvira Monday, MD 01/13/18 [phone]

## 2018-03-09 ENCOUNTER — Other Ambulatory Visit: Payer: Self-pay

## 2018-03-09 ENCOUNTER — Encounter (HOSPITAL_COMMUNITY): Payer: Self-pay | Admitting: Radiology

## 2018-03-09 ENCOUNTER — Emergency Department (HOSPITAL_COMMUNITY): Payer: Self-pay

## 2018-03-09 ENCOUNTER — Observation Stay (HOSPITAL_COMMUNITY)
Admission: EM | Admit: 2018-03-09 | Discharge: 2018-03-10 | Disposition: A | Discharge status: Home / Self Care | Payer: Self-pay | Attending: Oncology | Admitting: Oncology

## 2018-03-09 DIAGNOSIS — R109 Unspecified abdominal pain: Secondary | ICD-10-CM | POA: Diagnosis present

## 2018-03-09 DIAGNOSIS — N50819 Testicular pain, unspecified: Secondary | ICD-10-CM | POA: Insufficient documentation

## 2018-03-09 DIAGNOSIS — R8281 Pyuria: Secondary | ICD-10-CM

## 2018-03-09 DIAGNOSIS — D72829 Elevated white blood cell count, unspecified: Secondary | ICD-10-CM

## 2018-03-09 DIAGNOSIS — F12288 Cannabis dependence with other cannabis-induced disorder: Secondary | ICD-10-CM

## 2018-03-09 DIAGNOSIS — R112 Nausea with vomiting, unspecified: Secondary | ICD-10-CM

## 2018-03-09 DIAGNOSIS — R369 Urethral discharge, unspecified: Secondary | ICD-10-CM | POA: Insufficient documentation

## 2018-03-09 DIAGNOSIS — Z8619 Personal history of other infectious and parasitic diseases: Secondary | ICD-10-CM

## 2018-03-09 DIAGNOSIS — R1084 Generalized abdominal pain: Principal | ICD-10-CM

## 2018-03-09 DIAGNOSIS — R3129 Other microscopic hematuria: Secondary | ICD-10-CM | POA: Insufficient documentation

## 2018-03-09 DIAGNOSIS — K561 Intussusception: Secondary | ICD-10-CM

## 2018-03-09 DIAGNOSIS — F1220 Cannabis dependence, uncomplicated: Secondary | ICD-10-CM

## 2018-03-09 DIAGNOSIS — F1290 Cannabis use, unspecified, uncomplicated: Secondary | ICD-10-CM

## 2018-03-09 DIAGNOSIS — R1013 Epigastric pain: Secondary | ICD-10-CM

## 2018-03-09 DIAGNOSIS — R3121 Asymptomatic microscopic hematuria: Secondary | ICD-10-CM

## 2018-03-09 HISTORY — DX: Cannabis dependence with other cannabis-induced disorder: F12.288

## 2018-03-09 LAB — URINALYSIS, ROUTINE W REFLEX MICROSCOPIC
BILIRUBIN URINE: NEGATIVE
GLUCOSE, UA: NEGATIVE mg/dL
KETONES UR: NEGATIVE mg/dL
Nitrite: NEGATIVE
PH: 6 (ref 5.0–8.0)
Protein, ur: NEGATIVE mg/dL
Specific Gravity, Urine: >1.046 — ABNORMAL HIGH (ref 1.005–1.030)

## 2018-03-09 LAB — COMPREHENSIVE METABOLIC PANEL
ALBUMIN: 4 g/dL (ref 3.5–5.0)
ALK PHOS: 73 U/L (ref 38–126)
ALT: 17 U/L (ref 0–44)
AST: 20 U/L (ref 15–41)
Anion gap: 12 (ref 5–15)
BUN: 16 mg/dL (ref 6–20)
CALCIUM: 9.6 mg/dL (ref 8.9–10.3)
CHLORIDE: 98 mmol/L (ref 98–111)
CO2: 26 mmol/L (ref 22–32)
Creatinine, Ser: 0.99 mg/dL (ref 0.61–1.24)
GFR calc non Af Amer: >60 mL/min (ref >60)
Glucose, Bld: 118 mg/dL — ABNORMAL HIGH (ref 70–99)
Potassium: 3.6 mmol/L (ref 3.5–5.1)
SODIUM: 136 mmol/L (ref 135–145)
Total Bilirubin: 0.7 mg/dL (ref 0.3–1.2)
Total Protein: 8.1 g/dL (ref 6.5–8.1)

## 2018-03-09 LAB — CBC
HCT: 46 % (ref 39.0–52.0)
Hemoglobin: 14.9 g/dL (ref 13.0–17.0)
MCH: 29.8 pg (ref 26.0–34.0)
MCHC: 32.4 g/dL (ref 30.0–36.0)
MCV: 92 fL (ref 80.0–100.0)
NRBC: 0 % (ref 0.0–0.2)
PLATELETS: 313 10*3/uL (ref 150–400)
RBC: 5 MIL/uL (ref 4.22–5.81)
RDW: 13.1 % (ref 11.5–15.5)
WBC: 19.7 10*3/uL — ABNORMAL HIGH (ref 4.0–10.5)

## 2018-03-09 LAB — LIPASE, BLOOD: LIPASE: 38 U/L (ref 11–51)

## 2018-03-09 MED ORDER — MORPHINE SULFATE (PF) 4 MG/ML IV SOLN
4.0000 mg | Freq: Once | INTRAVENOUS | Status: AC
  Administered 2018-03-09: 4 mg via INTRAVENOUS
  Filled 2018-03-09: qty 1

## 2018-03-09 MED ORDER — LACTATED RINGERS IV SOLN
INTRAVENOUS | Status: DC
  Administered 2018-03-09: 17:00:00 via INTRAVENOUS

## 2018-03-09 MED ORDER — ONDANSETRON HCL 4 MG/2ML IJ SOLN
4.0000 mg | Freq: Once | INTRAMUSCULAR | Status: AC
  Administered 2018-03-09: 4 mg via INTRAVENOUS
  Filled 2018-03-09: qty 2

## 2018-03-09 MED ORDER — LACTATED RINGERS IV BOLUS
1000.0000 mL | Freq: Once | INTRAVENOUS | Status: AC
  Administered 2018-03-09: 1000 mL via INTRAVENOUS

## 2018-03-09 MED ORDER — STERILE WATER FOR INJECTION IJ SOLN
INTRAMUSCULAR | Status: AC
  Administered 2018-03-09: 1 mL
  Filled 2018-03-09: qty 10

## 2018-03-09 MED ORDER — ONDANSETRON HCL 4 MG/2ML IJ SOLN
4.0000 mg | Freq: Once | INTRAMUSCULAR | Status: AC | PRN
  Administered 2018-03-09: 4 mg via INTRAVENOUS
  Filled 2018-03-09: qty 2

## 2018-03-09 MED ORDER — ACETAMINOPHEN 650 MG RE SUPP: 650.0000 mg | Freq: Four times a day (QID) | RECTAL | Status: DC | PRN

## 2018-03-09 MED ORDER — PIPERACILLIN-TAZOBACTAM 3.375 G IVPB 30 MIN
3.3750 g | Freq: Once | INTRAVENOUS | Status: AC
  Administered 2018-03-09: 3.375 g via INTRAVENOUS
  Filled 2018-03-09: qty 50

## 2018-03-09 MED ORDER — IBUPROFEN 600 MG PO TABS
600.0000 mg | ORAL_TABLET | Freq: Four times a day (QID) | ORAL | Status: DC | PRN
  Filled 2018-03-09: qty 1

## 2018-03-09 MED ORDER — ONDANSETRON HCL 4 MG/2ML IJ SOLN: 4.0000 mg | Freq: Four times a day (QID) | INTRAMUSCULAR | Status: DC | PRN

## 2018-03-09 MED ORDER — METOCLOPRAMIDE HCL 5 MG/ML IJ SOLN
10.0000 mg | Freq: Once | INTRAMUSCULAR | Status: AC
  Administered 2018-03-09: 10 mg via INTRAVENOUS
  Filled 2018-03-09: qty 2

## 2018-03-09 MED ORDER — KETOROLAC TROMETHAMINE 15 MG/ML IJ SOLN
15.0000 mg | Freq: Once | INTRAMUSCULAR | Status: AC
  Administered 2018-03-09: 15 mg via INTRAVENOUS
  Filled 2018-03-09: qty 1

## 2018-03-09 MED ORDER — ONDANSETRON HCL 4 MG PO TABS: 4.0000 mg | ORAL_TABLET | Freq: Four times a day (QID) | ORAL | Status: DC | PRN

## 2018-03-09 MED ORDER — ENOXAPARIN SODIUM 40 MG/0.4ML ~~LOC~~ SOLN
40.0000 mg | SUBCUTANEOUS | Status: DC
  Filled 2018-03-09: qty 0.4

## 2018-03-09 MED ORDER — FENTANYL CITRATE (PF) 100 MCG/2ML IJ SOLN
50.0000 ug | Freq: Once | INTRAMUSCULAR | Status: AC
  Administered 2018-03-09: 50 ug via INTRAVENOUS
  Filled 2018-03-09: qty 2

## 2018-03-09 MED ORDER — IOHEXOL 300 MG/ML  SOLN
100.0000 mL | Freq: Once | INTRAMUSCULAR | Status: AC | PRN
  Administered 2018-03-09: 100 mL via INTRAVENOUS

## 2018-03-09 MED ORDER — ACETAMINOPHEN 325 MG PO TABS
650.0000 mg | ORAL_TABLET | Freq: Four times a day (QID) | ORAL | Status: DC | PRN
  Administered 2018-03-09 – 2018-03-10 (×3): 650 mg via ORAL
  Filled 2018-03-09 (×3): qty 2

## 2018-03-09 MED ORDER — CEFTRIAXONE SODIUM 250 MG IJ SOLR
250.0000 mg | Freq: Once | INTRAMUSCULAR | Status: AC
  Administered 2018-03-09: 250 mg via INTRAMUSCULAR
  Filled 2018-03-09: qty 250

## 2018-03-09 MED ORDER — ALUM & MAG HYDROXIDE-SIMETH 200-200-20 MG/5ML PO SUSP
30.0000 mL | Freq: Once | ORAL | Status: DC
  Filled 2018-03-09: qty 30

## 2018-03-09 MED ORDER — AZITHROMYCIN 250 MG PO TABS
1000.0000 mg | ORAL_TABLET | Freq: Once | ORAL | Status: AC
  Administered 2018-03-09: 1000 mg via ORAL
  Filled 2018-03-09: qty 4

## 2018-03-09 MED ORDER — SODIUM CHLORIDE 0.9 % IV BOLUS
1000.0000 mL | Freq: Once | INTRAVENOUS | Status: AC
  Administered 2018-03-09: 1000 mL via INTRAVENOUS

## 2018-03-09 NOTE — H&P (Addendum)
Date: 03/09/2018               Patient Name:  Jani FilesQuintin Ray Cella MRN: [id number]  DOB: 04/03/1981 Age / Sex: 37 y.o., male   PCP: Default, Provider, MD         Medical Service: Internal Medicine Teaching Service         Attending Physician: Dr. Cephas DarbyJames Granfortuna    First Contact: Dr. Lenward Chancellorarley Bloomfield Pager: [phone]  Second Contact: Dr. Levora DredgeJustin Helberg  Pager: [phone]       After Hours (After 5p/  First Contact Pager: [phone]  weekends / holidays): Second Contact Pager: [phone]   Chief Complaint: Left-sided abdominal pain  History of Present Illness: Mr. Delford FieldWright is a [age] year old male with a history of cannabis hyperemesis syndrome who presents with a 3-day history of emesis and abdominal pain.  Reports that he was in his usual state of health until Tuesday when he "fried green beans" which he states he has never eaten.  He woke up the next morning with diffuse abdominal pain worse on the left side.  He also had several episodes of bilious vomiting which self resolved 2 days ago.  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  Did however feel fatigued during this time.  He states that last night his abdominal pain became even worse and he was unable to move.  The pain is worsened with movement and is sharp in nature.    He states that he has had a bowel movement for several days.  He normally has at least once a day.  He denies fevers, shortness of breath, and chest pain.  He denies any sick contacts does not believe that anyone who a the green beans also got sick. He states that several weeks ago he went to a bachelor's party where he had unprotected sex.  He then began to have penile discharge and testicular pain.  He then went to the [facility] where he was given an "an antibiotic shot".  He states that after this medication his symptoms resolved.  In the ED, a right upper quadrant ultrasound was performed and was  negative.  Labs significant for leukocytosis of 19.7, abnormal urinalysis with moderate hemoglobin, large leukocytes, >50 RBC & WBC, and rare bacteria.  Lipase WNL. CMP unremarkable.  Due to his significant leukocytosis a CT abdomen and pelvis was performed which showed intussusception.  General surgery was consulted and recommended CT with oral contrast.  A CT without contrast was performed and showed "short segment residual component of left abdominal small bowel intussusception".  His pain did not improve with morphine an ondansetron.  He was also treated periodically for sexually transmitted infection with Rocephin/azithromycin.  He will be admitted to monitor for worsening of intussusception.  Meds:  No outpatient medications have been marked as taking for the 03/09/18 encounter [facility](Hospital Encounter).     Allergies: Allergies as of 03/09/2018  . (No Known Allergies)   Past Medical History:  Diagnosis Date  . 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  He reports intermittent marijuana use but states that he has not smoked in over 2 weeks.  He also endorses occasional alcohol use.  He denies any drug use.  Review of Systems: A complete ROS was negative except  as per HPI.   Physical Exam: Blood pressure (!) 123/56, pulse 60, temperature 98.9 F (37.2 C), temperature source Oral, resp. rate 16, SpO2 100 %. Physical Exam  Constitutional: He is oriented to person, place, and time. He appears well-developed and well-nourished.  HENT:  Head: Normocephalic and atraumatic.  Eyes: Pupils are equal, round, and reactive to light. EOM are normal.  Cardiovascular: Normal rate, regular rhythm and normal heart sounds.  Pulmonary/Chest: Effort normal and breath sounds normal.  Abdominal: Soft. Normal appearance and bowel sounds are normal.  No abdominal  tenderness to palpation.  Neurological: He is alert and oriented to person, place, and time.  Skin: Skin is warm and dry.  Psychiatric: He has a normal mood and affect. His behavior is normal.  Nursing note and vitals reviewed.   RUQ [place]: FINDINGS: Gallbladder: No gallstones or wall thickening visualized. No sonographic Murphy sign noted by sonographer. Common bile duct: Diameter: 4 mm, normal. Liver: There is a 2.4 cm round, hyperechoic mass without internal vascularity in the right hepatic lobe. Within normal limits in parenchymal echogenicity. Portal vein is patent on color Doppler imaging with normal direction of blood flow towards the liver. IMPRESSION: 1. No acute abnormality. 2. 2.4 cm hyperechoic lesion in the right lobe of the liver, probably a hemangioma, benign. If definitive characterization is required, consider follow-up MRI abdomen without and with MultiHance in 6 months.   CT Abd pelvis w/contrast: Lower chest: Minimal atelectasis or scarring in the lung bases. No pleural effusion. Hepatobiliary: 10 mm hyperenhancing lesion in the right hepatic dome, unchanged from 2013 and likely a flash filling hemangioma. 2 cm lesion centrally in the liver, also unchanged in size from 2013 and with some peripheral nodular enhancement most compatible with hemangioma. Unremarkable gallbladder. No biliary dilatation. Pancreas: Unremarkable. Spleen: Unremarkable. Adrenals/Urinary Tract: Unremarkable adrenal glands. Trace early excretion of contrast in the renal collecting systems limiting assessment for small calculi. No hydronephrosis or renal mass. Unremarkable bladder. Stomach/Bowel: Bowel assessment is limited by lack of enteric contrast material and paucity of abdominal fat. A small bowel intussusception is present in the left lower quadrant without associated obstruction. The appendix is likely visualized inferior to the cecum coursing laterally without evidence of  appendicitis. Vascular/Lymphatic: No significant vascular findings are present. No enlarged abdominal or pelvic lymph nodes. Reproductive: Mild chronic prostatic enlargement. Other: No ascites or pneumoperitoneum. No abdominal wall hernia. Musculoskeletal: No acute osseous abnormality or suspicious osseous Lesion. IMPRESSION: 1. Small bowel intussusception in the left lower quadrant. This can be a transient, incidental finding although an underlying mass acting as a lead point is also possible. No bowel obstruction. 2. No evidence of appendicitis. 3. Unchanged hepatic lesions most compatible with hemangiomas.  CT abd w/o contrast: IMPRESSION: 1. Possible short segment residual component of left abdominal small bowel intussusception, but this is not definite and is not well evaluated on this noncontrast CT exam. 2. No small bowel dilatation to suggest obstruction. 3. Small volume intraperitoneal free fluid. 4. 1.9 cm low-density lesion in the central liver corresponding to the apparent hemangioma seen on imaging earlier today. A second 9 mm lesion noted in the dome of the liver.  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 Komatsu presented with diffuse abdominal pain (worse on left side) and was found to have  a leukocytosis and intussusception on CT.  His abdominal pain improved with Antiemetics and morphine.  His repeat CT with oral contrast showed improvement of his intussusception and surgery is not indicated at this time.  It is also reassuring that his abdominal pain has significantly improved and he does not show signs of ischemia.   Intussusception:  1.  Continuous IV fluids 2.  Zofran 4 mg as needed nausea  Leukocytosis: Likely secondary to intussusception versus STI 1.  Continue  to monitor  Presumed sexually transmitted infection: Suggested by urinalysis and history of unprotected sex, penile discharge, and testicular pain. 1. S/p empiric treatment with Rocephin and azithromycin  Dispo: Admit patient to Observation with expected length of stay less than 2 midnights.  Signed: Synetta Shadow, MD 03/09/2018, 3:55 PM  Pager: [phone]

## 2018-03-09 NOTE — ED Triage Notes (Signed)
Per EMS pt reports 3 days of nausea and vomiting. Constipation x 3 days. Pt reports 10/10 pain.  Abdomen soft and nontender. Unable to tolerate po intake.

## 2018-03-09 NOTE — ED Notes (Signed)
Nurse starting IV and will draw labs. 

## 2018-03-09 NOTE — ED Notes (Signed)
ORDERED DIET TRAY  

## 2018-03-09 NOTE — Consult Note (Signed)
Corey Atkinson  11/04/1980 [id number]  CARE TEAM:  PCP: Default, Provider, MD  Outpatient Care Team: Patient Care Team: Default, Provider, MD as PCP - General  Inpatient Treatment Team: Treatment Team: Attending Provider: Duffy Bruce, MD; Registered Nurse: Martinique, Traypaniel, RN; Consulting Physician: Edison Pace, Md, MD   This patient is a 37 y.o.male who presents today for surgical evaluation at the request of Dr Ellender Hose.   Chief complaint / Reason for evaluation: Abdominal pain with intussusception incidentally noted  Young male with history of crampy abdominal pain.  Nausea vomiting.  Persisted over the past several days.  History of gastritis and gastritis in the past.  He wonders that was the case.  No flatus or diarrhea though.  Denies any sick contacts.  Did have episode of unprotected sex with some penile discharge.  Prior abdominal surgeries.  Patient wears concerns came to emergency room.  Abdominal complaints seem to potentially right-sided.  Ultrasound negative for any gallstones or cholecystitis.  CT scan showing no perforation obstruction pain appendicitis or diverticulitis.  Short segment small bowel intussusception noted.  Surgical consultation requested to make sure this is not an issue.  He does have a history of intermittent abdominal pains.  Cannabinoid hyper emesis suspected in the past.  No personal nor family history of GI/colon cancer, inflammatory bowel disease, irritable bowel syndrome, allergy such as Celiac Sprue, dietary/dairy problems, colitis, ulcers nor gastritis.  No recent sick contacts/gastroenteritis.  No travel outside the country.  No changes in diet.  No dysphagia to solids or liquids.  No significant heartburn or reflux.  No hematochezia, hematemesis, coffee ground emesis.  No evidence of prior gastric/peptic ulceration. Normally can walk 30 minutes without difficulty.  No exertional chest pain.   Assessment  Corey Atkinson  37 y.o. male      Problem List:  Active Problems:   Abdominal pain, diffuse   Nausea & vomiting   Nausea vomiting crampy abdominal pain with intussusceptum initially noted on noncontrasted CT scan  Plan:  -I would repeat the CT scan with oral contrast.  Get a good slow well contrasted scan to see if that helps.  He is thirsty and wants to try and drink something anyway.  If it is less than 4 cm in length or no longer there, that is reassuring that the incident intussusception was really just physiologic peristalsis as is usually the case.  If there is a persistent long segment of intussusception greater than 4 cm, that could imply an endoluminal mass that is causing an issue.  That would require surgical resection.  The fact that he has had underwhelming CT scans in 2013 and 2015 as a hopefully a reassuring sign, but we will see  IV fluid resuscitation.  Nausea control.  Defer work-up for possible STD to emergency department.  Cannabinoid dependence.  Defer work-up for hyperemesis syndrome related to that to the internal medicine/emergency medicine.  -VTE prophylaxis- SCDs, etc -mobilize as tolerated to help recovery  20 minutes spent in review, evaluation, examination, counseling, and coordination of care.  More than 50% of that time was spent in counseling.  Adin Hector, MD, FACS, MASCRS Gastrointestinal and Minimally Invasive Surgery    [address], [place], [place] [address] [phone] Main / Paging [phone] Fax   03/09/2018      History reviewed. No pertinent past medical history.  No past surgical history on file.  Social History   Socioeconomic History  . Marital  status: Single    Spouse name: Not on file  . Number of children: Not on file  . Years of education: Not on file  . Highest education level: Not on file  Occupational History  . Not on file  Social Needs  . Financial resource strain: Not on file  . Food insecurity:    Worry:  Not on file    Inability: Not on file  . Transportation needs:    Medical: Not on file    Non-medical: Not on file  Tobacco Use  . Smoking status: Former Smoker    Types: Cigarettes    Last attempt to quit: 11/12/2013    Years since quitting: 4.3  Substance and Sexual Activity  . Alcohol use: No  . Drug use: No  . Sexual activity: Yes    Birth control/protection: Condom  Lifestyle  . Physical activity:    Days per week: Not on file    Minutes per session: Not on file  . Stress: Not on file  Relationships  . Social connections:    Talks on phone: Not on file    Gets together: Not on file    Attends religious service: Not on file    Active member of club or organization: Not on file    Attends meetings of clubs or organizations: Not on file    Relationship status: Not on file  . Intimate partner violence:    Fear of current or ex partner: Not on file    Emotionally abused: Not on file    Physically abused: Not on file    Forced sexual activity: Not on file  Other Topics Concern  . Not on file  Social History Narrative  . Not on file    No family history on file.  Current Facility-Administered Medications  Medication Dose Route Frequency Provider Last Rate Last Dose  . alum & mag hydroxide-simeth (MAALOX/MYLANTA) 200-200-20 MG/5ML suspension 30 mL  30 mL Oral Once Cardama, Grayce Sessions, MD   Stopped at 03/09/18 0600  . lactated ringers bolus 1,000 mL  1,000 mL Intravenous Once Michael Boston, MD      . piperacillin-tazobactam (ZOSYN) IVPB 3.375 g  3.375 g Intravenous Once Duffy Bruce, MD 100 mL/hr at 03/09/18 1114 3.375 g at 03/09/18 1114   Current Outpatient Medications  Medication Sig Dispense Refill  . dicyclomine (BENTYL) 20 MG tablet Take 1 tablet (20 mg total) by mouth 2 (two) times daily. (Patient not taking: Reported on 03/09/2018) 20 tablet 0  . HYDROcodone-acetaminophen (NORCO/VICODIN) 5-325 MG per tablet Take 1-2 tablets by mouth every 4 (four) hours as  needed. (Patient not taking: Reported on 03/09/2018) 20 tablet 0  . ondansetron (ZOFRAN ODT) 4 MG disintegrating tablet Take 1 tablet (4 mg total) by mouth every 8 (eight) hours as needed for nausea or vomiting. (Patient not taking: Reported on 03/09/2018) 20 tablet 0  . ondansetron (ZOFRAN) 4 MG tablet Take 1 tablet (4 mg total) by mouth every 6 (six) hours. (Patient not taking: Reported on 03/09/2018) 12 tablet 0  . oxyCODONE-acetaminophen (PERCOCET/ROXICET) 5-325 MG per tablet Take 2 tablets by mouth every 4 (four) hours as needed for severe pain. (Patient not taking: Reported on 01/12/2018) 15 tablet 0     No Known Allergies  ROS:   All other systems reviewed & are negative except per HPI or as noted below: Constitutional:  No fevers, chills, sweats.  Weight stable Eyes:  No vision changes, No discharge HENT:  No sore throats,  nasal drainage Lymph: No neck swelling, No bruising easily Pulmonary:  No cough, productive sputum CV: No orthopnea, PND  Patient walks 60 minutes for about 2 miles without difficulty.  No exertional chest/neck/shoulder/arm pain. GI: No personal nor family history of GI/colon cancer, inflammatory bowel disease, irritable bowel syndrome, allergy such as Celiac Sprue, dietary/dairy problems, colitis, ulcers nor gastritis.  No recent sick contacts/gastroenteritis.  No travel outside the country.  No changes in diet. Renal: No UTIs, No hematuria Genital:  No drainage, bleeding, masses Musculoskeletal: No severe joint pain.  Good ROM major joints Skin:  No sores or lesions.  No rashes Heme/Lymph:  No easy bleeding.  No swollen lymph nodes Neuro: No focal weakness/numbness.  No seizures Psych: No suicidal ideation.  No hallucinations  BP 127/88   Pulse 64   Temp 98.6 F (37 C) (Oral)   Resp 16   SpO2 95%   Physical Exam: General: Pt awake/alert/oriented x4 in no major acute distress Eyes: PERRL, normal EOM. Sclera nonicteric Neuro: CN II-XII intact w/o focal  sensory/motor deficits. Lymph: No head/neck/groin lymphadenopathy Psych:  No delerium/psychosis/paranoia HENT: Normocephalic, Mucus membranes moist.  No thrush Neck: Supple, No tracheal deviation Chest: No pain.  Good respiratory excursion. CV:  Pulses intact.  Regular rhythm Abdomen: Flat muscular abdomen.  He can relax for it to be soft.  No guarding.  No peritonitis.  Mild diffuse abdominal discomfort but no specific focal point.  No umbilical hernia.  No diastases.   Gen:  No inguinal hernias.  No inguinal lymphadenopathy.   Ext:  SCDs BLE.  No significant edema.  No cyanosis Skin: No petechiae / purpurea.  No major sores Musculoskeletal: No severe joint pain.  Good ROM major joints   Results:   Labs: Results for orders placed or performed during the hospital encounter of 03/09/18 (from the past 48 hour(s))  Lipase, blood     Status: None   Collection Time: 03/09/18  5:27 AM  Result Value Ref Range   Lipase 38 11 - 51 U/L    Comment: Performed at [facility], [place] [address], [place], [place] [address]  Comprehensive metabolic panel     Status: Abnormal   Collection Time: 03/09/18  5:27 AM  Result Value Ref Range   Sodium 136 135 - 145 mmol/L   Potassium 3.6 3.5 - 5.1 mmol/L   Chloride 98 98 - 111 mmol/L   CO2 26 22 - 32 mmol/L   Glucose, Bld 118 (H) 70 - 99 mg/dL   BUN 16 6 - 20 mg/dL   Creatinine, Ser 0.99 0.61 - 1.24 mg/dL   Calcium 9.6 8.9 - 10.3 mg/dL   Total Protein 8.1 6.5 - 8.1 g/dL   Albumin 4.0 3.5 - 5.0 g/dL   AST 20 15 - 41 U/L   ALT 17 0 - 44 U/L   Alkaline Phosphatase 73 38 - 126 U/L   Total Bilirubin 0.7 0.3 - 1.2 mg/dL   GFR calc non Af Amer >60 >60 mL/min   GFR calc Af Amer >60 >60 mL/min    Comment: (NOTE) The eGFR has been calculated using the CKD EPI equation. This calculation has not been validated in all clinical situations. eGFR's persistently <60 mL/min signify possible Chronic Kidney Disease.    Anion gap 12 5 - 15    Comment:  Performed at [facility] [address], [place], [place] [address]  CBC     Status: Abnormal   Collection Time: 03/09/18  5:27 AM  Result Value Ref Range   WBC 19.7 (H) 4.0 - 10.5 K/uL   RBC 5.00 4.22 - 5.81 MIL/uL   Hemoglobin 14.9 13.0 - 17.0 g/dL   HCT 46.0 39.0 - 52.0 %   MCV 92.0 80.0 - 100.0 fL   MCH 29.8 26.0 - 34.0 pg   MCHC 32.4 30.0 - 36.0 g/dL   RDW 13.1 11.5 - 15.5 %   Platelets 313 150 - 400 K/uL   nRBC 0.0 0.0 - 0.2 %    Comment: Performed at [facility], [place] [address] [place], [place] [address]  Urinalysis, Routine w reflex microscopic     Status: Abnormal   Collection Time: 03/09/18 10:16 AM  Result Value Ref Range   Color, Urine YELLOW YELLOW   APPearance HAZY (A) CLEAR   Specific Gravity, Urine >1.046 (H) 1.005 - 1.030   pH 6.0 5.0 - 8.0   Glucose, UA NEGATIVE NEGATIVE mg/dL   Hgb urine dipstick MODERATE (A) NEGATIVE   Bilirubin Urine NEGATIVE NEGATIVE   Ketones, ur NEGATIVE NEGATIVE mg/dL   Protein, ur NEGATIVE NEGATIVE mg/dL   Nitrite NEGATIVE NEGATIVE   Leukocytes, UA LARGE (A) NEGATIVE   RBC / HPF >50 (H) 0 - 5 RBC/hpf   WBC, UA >50 (H) 0 - 5 WBC/hpf   Bacteria, UA RARE (A) NONE SEEN   Squamous Epithelial / LPF 0-5 0 - 5   Mucus PRESENT     Comment: Performed at [facility], [place] [address], [place], [place] [address]    Imaging / Studies: Ct Abdomen Pelvis W Contrast  Result Date: 03/09/2018 CLINICAL DATA:  Midline and right lower quadrant abdominal pain. Nausea and vomiting for 3 days. EXAM: CT ABDOMEN AND PELVIS WITH CONTRAST TECHNIQUE: Multidetector CT imaging of the abdomen and pelvis was performed using the standard protocol following bolus administration of intravenous contrast. CONTRAST:  164m OMNIPAQUE IOHEXOL 300 MG/ML  SOLN COMPARISON:  Right upper quadrant abdominal ultrasound 03/09/2018. CT abdomen and pelvis 09/23/2013 and 04/04/2012. FINDINGS: Lower chest: Minimal atelectasis or scarring in the lung bases. No  pleural effusion. Hepatobiliary: 10 mm hyperenhancing lesion in the right hepatic dome, unchanged from 2013 and likely a flash filling hemangioma. 2 cm lesion centrally in the liver, also unchanged in size from 2013 and with some peripheral nodular enhancement most compatible with hemangioma. Unremarkable gallbladder. No biliary dilatation. Pancreas: Unremarkable. Spleen: Unremarkable. Adrenals/Urinary Tract: Unremarkable adrenal glands. Trace early excretion of contrast in the renal collecting systems limiting assessment for small calculi. No hydronephrosis or renal mass. Unremarkable bladder. Stomach/Bowel: Bowel assessment is limited by lack of enteric contrast material and paucity of abdominal fat. A small bowel intussusception is present in the left lower quadrant without associated obstruction. The appendix is likely visualized inferior to the cecum coursing laterally without evidence of appendicitis. Vascular/Lymphatic: No significant vascular findings are present. No enlarged abdominal or pelvic lymph nodes. Reproductive: Mild chronic prostatic enlargement. Other: No ascites or pneumoperitoneum. No abdominal wall hernia. Musculoskeletal: No acute osseous abnormality or suspicious osseous lesion. IMPRESSION: 1. Small bowel intussusception in the left lower quadrant. This can be a transient, incidental finding although an underlying mass acting as a lead point is also possible. No bowel obstruction. 2. No evidence of appendicitis. 3. Unchanged hepatic lesions most compatible with hemangiomas. Electronically Signed   By: ALogan BoresM.D.   On: 03/09/2018 10:11   UKoreaAbdomen Limited Ruq  Result Date: 03/09/2018 CLINICAL DATA:  Epigastric pain. EXAM: ULTRASOUND  ABDOMEN LIMITED RIGHT UPPER QUADRANT COMPARISON:  CT abdomen and pelvis dated September 23, 2013. FINDINGS: Gallbladder: No gallstones or wall thickening visualized. No sonographic Murphy sign noted by sonographer. Common bile duct: Diameter: 4 mm, normal.  Liver: There is a 2.4 cm round, hyperechoic mass without internal vascularity in the right hepatic lobe. Within normal limits in parenchymal echogenicity. Portal vein is patent on color Doppler imaging with normal direction of blood flow towards the liver. IMPRESSION: 1. No acute abnormality. 2. 2.4 cm hyperechoic lesion in the right lobe of the liver, probably a hemangioma, benign. If definitive characterization is required, consider follow-up MRI abdomen without and with MultiHance in 6 months. Electronically Signed   By: Titus Dubin M.D.   On: 03/09/2018 08:50    Medications / Allergies: per chart  Antibiotics: Anti-infectives (From admission, onward)   Start     Dose/Rate Route Frequency Ordered Stop   03/09/18 1115  cefTRIAXone (ROCEPHIN) injection 250 mg     250 mg Intramuscular  Once 03/09/18 1114 03/09/18 1132   03/09/18 1115  azithromycin (ZITHROMAX) tablet 1,000 mg     1,000 mg Oral  Once 03/09/18 1114 03/09/18 1131   03/09/18 1045  piperacillin-tazobactam (ZOSYN) IVPB 3.375 g     3.375 g 100 mL/hr over 30 Minutes Intravenous  Once 03/09/18 1035          Note: Portions of this report may have been transcribed using voice recognition software. Every effort was made to ensure accuracy; however, inadvertent computerized transcription errors may be present.   Any transcriptional errors that result from this process are unintentional.    Adin Hector, MD, FACS, MASCRS Gastrointestinal and Minimally Invasive Surgery    [address] [place], [place] [address] [phone] Main / Paging [phone] Fax   03/09/2018

## 2018-03-09 NOTE — ED Provider Notes (Addendum)
Assumed care @ 8 AM from Dr. Eudelia Bunchardama.   Clinical Course as of Mar 09 1118  Sat Mar 09, 2018  [id number] Assumed care @ 8 AM. 37 yo M here with RUQ pain, nausea. Labs show leukocytosis, LFTs and Bili wnl. Mild TTP on exam. Plan to follow-up U/S and re-assess.   [CI]  [id number] U/S read as negative. Pt resting comfortably. Will PO challenge, plan to d/c with antiemetics and antacids if able to tolerate. VSS.   [CI]  0910 Pt now awake, c/o more abd pain. Will check CT given degree of leukocytosis. Continue fluids, antiemetics.   [CI]  1113 CT scan c/f intusussception. CCS paged. Otherwise, UA positive - pt admits to unprotected sex and penile discharge for "a bit." Doubt this is related to his LLQ pain, but will tx empirically. Rocephin/Azithro. No trich on UA.   [CI]    Clinical Course User Index [CI] Shaune PollackIsaacs, Danajah Birdsell, MD    Surgery saw pt, recommended repeat CT which shows no worsening or significant persistence. Pain improving but pt with persistent nausea, vomiting. Suspect viral GI versus food-borne illness, possible cannibas hyperemesis, w/ intractable n/v. Admit for fluids, sx control.Dr. Margaree Mackintoshsui aware.    Shaune PollackIsaacs, Tieshia Rettinger, MD 03/09/18 1119    Shaune PollackIsaacs, Merrick Maggio, MD 03/09/18 [phone]

## 2018-03-09 NOTE — ED Notes (Signed)
Heating pack applied to mid abdomen. Pt unable to take Maalox at present; nausea and pain meds given

## 2018-03-09 NOTE — ED Provider Notes (Signed)
[facility] EMERGENCY DEPARTMENT Provider Note  CSN: [id number] Arrival date & time: 03/09/18 4098  Chief Complaint(s) Abdominal Pain  HPI Corey Atkinson is a 37 y.o. male   The history is provided by the patient.  Abdominal Pain   This is a recurrent problem. The current episode started 2 days ago. Episode frequency: intermittent. Progression since onset: fluctuating. The pain is associated with an unknown factor. The pain is located in the generalized abdominal region. The quality of the pain is cramping. The pain is moderate. Associated symptoms include nausea, vomiting and constipation. Pertinent negatives include fever, diarrhea, hematochezia and melena. Nothing aggravates the symptoms. Nothing relieves the symptoms.   Decreased PO tolerance for 2 days. States he ate fried string bean prior to symptoms starting.  States that he stopped smoking marijuana "a while ago."  Declined alcohol use. No other ilicit drug use.  Past Medical History No past medical history on file. There are no active problems to display for this patient.  Home Medication(s) Prior to Admission medications   Medication Sig Start Date End Date Taking? Authorizing Provider  dicyclomine (BENTYL) 20 MG tablet Take 1 tablet (20 mg total) by mouth 2 (two) times daily. Patient not taking: Reported on 03/09/2018 01/12/18   Alvira Monday, MD  HYDROcodone-acetaminophen (NORCO/VICODIN) 5-325 MG per tablet Take 1-2 tablets by mouth every 4 (four) hours as needed. Patient not taking: Reported on 03/09/2018 02/12/14   Marlon Pel, PA-C  ondansetron (ZOFRAN ODT) 4 MG disintegrating tablet Take 1 tablet (4 mg total) by mouth every 8 (eight) hours as needed for nausea or vomiting. Patient not taking: Reported on 03/09/2018 01/12/18   Alvira Monday, MD  ondansetron (ZOFRAN) 4 MG tablet Take 1 tablet (4 mg total) by mouth every 6 (six) hours. Patient not taking: Reported on 03/09/2018 02/12/14    Marlon Pel, PA-C  oxyCODONE-acetaminophen (PERCOCET/ROXICET) 5-325 MG per tablet Take 2 tablets by mouth every 4 (four) hours as needed for severe pain. Patient not taking: Reported on 01/12/2018 06/30/14   Osie Cheeks                                                                                                                                    Past Surgical History No past surgical history on file. Family History No family history on file.  Social History Social History   Tobacco Use  . Smoking status: Former Smoker    Types: Cigarettes    Last attempt to quit: 11/12/2013    Years since quitting: 4.3  Substance Use Topics  . Alcohol use: No  . Drug use: No   Allergies Patient has no known allergies.  Review of Systems Review of Systems  Constitutional: Negative for fever.  Gastrointestinal: Positive for abdominal pain, constipation, nausea and vomiting. Negative for diarrhea, hematochezia and melena.   All other systems are reviewed and are negative for acute change except  as noted in the HPI  Physical Exam Vital Signs  I have reviewed the triage vital signs BP 127/88   Pulse 64   Temp 98.6 F (37 C) (Oral)   Resp 16   SpO2 95%   Physical Exam  Constitutional: He is oriented to person, place, and time. He appears well-developed and well-nourished. No distress.  HENT:  Head: Normocephalic and atraumatic.  Right Ear: External ear normal.  Left Ear: External ear normal.  Nose: Nose normal.  Mouth/Throat: Mucous membranes are normal. No trismus in the jaw.  Eyes: Conjunctivae and EOM are normal. No scleral icterus.  Neck: Normal range of motion and phonation normal.  Cardiovascular: Normal rate and regular rhythm.  Pulmonary/Chest: Effort normal. No stridor. No respiratory distress.  Abdominal: He exhibits no distension. There is generalized tenderness (generalized, but mostly epigastric and RUQ). There is no rigidity, no rebound, no guarding, no CVA  tenderness, no tenderness at McBurney's point and negative Murphy's sign. No hernia.  Musculoskeletal: Normal range of motion. He exhibits no edema.  Neurological: He is alert and oriented to person, place, and time.  Skin: He is not diaphoretic.  Psychiatric: He has a normal mood and affect. His behavior is normal.  Vitals reviewed.   ED Results and Treatments Labs (all labs ordered are listed, but only abnormal results are displayed) Labs Reviewed  COMPREHENSIVE METABOLIC PANEL - Abnormal; Notable for the following components:      Result Value   Glucose, Bld 118 (*)    All other components within normal limits  CBC - Abnormal; Notable for the following components:   WBC 19.7 (*)    All other components within normal limits  LIPASE, BLOOD                                                                                                                         EKG  EKG Interpretation  Date/Time:    Ventricular Rate:    PR Interval:    QRS Duration:   QT Interval:    QTC Calculation:   R Axis:     Text Interpretation:        Radiology No results found. Pertinent labs & imaging results that were available during my care of the patient were reviewed by me and considered in my medical decision making (see chart for details).  Medications Ordered in ED Medications  alum & mag hydroxide-simeth (MAALOX/MYLANTA) 200-200-20 MG/5ML suspension 30 mL (30 mLs Oral Refused 03/09/18 0645)  fentaNYL (SUBLIMAZE) injection 50 mcg (has no administration in time range)  ondansetron (ZOFRAN) injection 4 mg (4 mg Intravenous Given 03/09/18 0540)  sodium chloride 0.9 % bolus 1,000 mL (0 mLs Intravenous Stopped 03/09/18 0646)  metoCLOPramide (REGLAN) injection 10 mg (10 mg Intravenous Given 03/09/18 0600)  Procedures Procedures  (including critical care  time)  Medical Decision Making / ED Course I have reviewed the nursing notes for this encounter and the patient's prior records (if available in EHR or on provided paperwork).    Patient here with 2 to 3 days of nausea and nonbloody nonbilious emesis with generalized abdominal pain mostly tender in the epigastric and right upper quadrant regions.  Initially treated empirically with IV fluids and nausea medicine.  Screening labs obtain revealed significant leukocytosis.  No anemia, significant electrolyte derangements or renal insufficiency.  No evidence of biliary obstruction or pancreatitis.  Given the right upper quadrant and epigastric abdominal pain this could be gastritis versus gallbladder disease.  Will obtain ultrasound of right upper quadrant.  I have low suspicion for appendicitis, diverticulitis, or small bowel obstruction at this time.  Could also be viral process versus food poisoning.  Patient care turned over to Dr Erma HeritageIsaacs at 0800. Patient case and results discussed in detail; please see their note for further ED managment.      Final Clinical Impression(s) / ED Diagnoses Final diagnoses:  Epigastric abdominal pain      This chart was dictated using voice recognition software.  Despite best efforts to proofread,  errors can occur which can change the documentation meaning.   Nira Connardama, Najee Manninen Eduardo, MD 03/09/18 [phone]

## 2018-03-09 NOTE — ED Notes (Signed)
Pt. Given cranberry juice.

## 2018-03-10 DIAGNOSIS — Z7251 High risk heterosexual behavior: Secondary | ICD-10-CM

## 2018-03-10 LAB — BASIC METABOLIC PANEL
Anion gap: 9 (ref 5–15)
BUN: 10 mg/dL (ref 6–20)
CALCIUM: 8.8 mg/dL — AB (ref 8.9–10.3)
CHLORIDE: 100 mmol/L (ref 98–111)
CO2: 29 mmol/L (ref 22–32)
CREATININE: 1.15 mg/dL (ref 0.61–1.24)
GFR calc non Af Amer: >60 mL/min (ref >60)
GLUCOSE: 107 mg/dL — AB (ref 70–99)
Potassium: 3.2 mmol/L — ABNORMAL LOW (ref 3.5–5.1)
Sodium: 138 mmol/L (ref 135–145)

## 2018-03-10 LAB — CBC
HCT: 39.1 % (ref 39.0–52.0)
Hemoglobin: 12.6 g/dL — ABNORMAL LOW (ref 13.0–17.0)
MCH: 29.7 pg (ref 26.0–34.0)
MCHC: 32.2 g/dL (ref 30.0–36.0)
MCV: 92.2 fL (ref 80.0–100.0)
Platelets: 261 10*3/uL (ref 150–400)
RBC: 4.24 MIL/uL (ref 4.22–5.81)
RDW: 12.8 % (ref 11.5–15.5)
WBC: 11.5 10*3/uL — ABNORMAL HIGH (ref 4.0–10.5)
nRBC: 0 % (ref 0.0–0.2)

## 2018-03-10 LAB — HIV ANTIBODY (ROUTINE TESTING W REFLEX): HIV SCREEN 4TH GENERATION: NONREACTIVE

## 2018-03-10 MED ORDER — POTASSIUM CHLORIDE CRYS ER 20 MEQ PO TBCR
40.0000 meq | EXTENDED_RELEASE_TABLET | Freq: Two times a day (BID) | ORAL | Status: DC
  Administered 2018-03-10: 40 meq via ORAL
  Filled 2018-03-10: qty 2

## 2018-03-10 NOTE — Discharge Summary (Signed)
Name: Corey FilesQuintin Ray Atkinson MRN: [id number] DOB: Apr 24, 1981 37 y.o. PCP: Default, Provider, MD  Date of Admission: 03/09/2018  5:23 AM Date of Discharge: 03/10/2018 Attending Physician: Levert FeinsteinGranfortuna, James M, MD  Discharge Diagnosis: 1. Abdominal Pain / Nausea and Vomiting   Discharge Medications: Allergies as of 03/10/2018   No Known Allergies     Medication List    STOP taking these medications   dicyclomine 20 MG tablet Commonly known as:  BENTYL   HYDROcodone-acetaminophen 5-325 MG tablet Commonly known as:  NORCO/VICODIN   ondansetron 4 MG disintegrating tablet Commonly known as:  ZOFRAN-ODT   ondansetron 4 MG tablet Commonly known as:  ZOFRAN   oxyCODONE-acetaminophen 5-325 MG tablet Commonly known as:  PERCOCET/ROXICET     Disposition and follow-up:   Corey Atkinson was discharged from [facility] in Stable condition.  At the hospital follow up visit please address:  1.  Abdominal Pain. No follow-up labs indicated.   2.  Labs / imaging needed at time of follow-up: None  3.  Pending labs/ test needing follow-up: None  Follow-up Appointments: Follow-up Information    [facility] Follow up.   Contact information: [address] [place] [id number] [phone]        Hospital Course by problem list:  Abdominal Pain / Nausea and Vomiting. Corey NickelQuintin Atkinson is a 37 year old male with no significant past medical history who presented to the emergency department with nausea vomiting and progressive abdominal pain. The patient has been seen seven times in the past two years in the emergency department for similar pain and presentation. Each time he voices concerns that it was triggered by eating certain foods. Previous workups had been unremarkable; however, on this presentation CT abdomen was significant for small bowel intussusception. General surgery was consulted and recommended repeat  CT abdomen with oral contrast that showed improvement in the intussusception. General surgery felt that this was unlikely to be the cause of the patient's abdominal pain and nausea/vomiting. They recommended admission for IV fluids and monitoring. The patient was admitted overnight and given IV fluids. His abdominal pain and nausea/vomiting resolved. He was able to tolerate PO intake, ambulate without difficulty, and a bowel movement. He was discharged in stable condition.  Discharge Vitals:   BP 128/84 (BP Location: Right Arm)   Pulse (!) 51   Temp 98.3 F (36.8 C) (Oral)   Resp 18   Ht 6\' 4"  (1.93 m)   Wt 83.3 kg   SpO2 100%   BMI 22.35 kg/m   Pertinent Labs, Studies, and Procedures:   RUQ Ultrasound  1. No acute abnormality. 2. 2.4 cm hyperechoic lesion in the right lobe of the liver, probably a hemangioma, benign. If definitive characterization is required, consider follow-up MRI abdomen without and with MultiHance in 6 months.  CT Abdomen with IV contrast  1. Small bowel intussusception in the left lower quadrant. This can be a transient, incidental finding although an underlying mass acting as a lead point is also possible. No bowel obstruction. 2. No evidence of appendicitis. 3. Unchanged hepatic lesions most compatible with hemangiomas.  CT Abdomen with oral contrast  1. Possible short segment residual component of left abdominal small bowel intussusception, but this is not definite and is not well evaluated on this noncontrast CT exam. 2. No small bowel dilatation to suggest obstruction. 3. Small volume intraperitoneal free fluid. 4. 1.9 cm low-density lesion in the central liver corresponding to the apparent  hemangioma seen on imaging earlier today. A second 9 mm lesion noted in the dome of the liver.  Discharge Instructions: Discharge Instructions    Diet - low sodium heart healthy   Complete by:  As directed    Increase activity slowly   Complete by:  As  directed      Signed: Levora Dredge, MD 03/10/2018, 10:39 AM

## 2018-03-10 NOTE — Progress Notes (Signed)
Patient discharged to home with instructions. 

## 2018-03-10 NOTE — Discharge Instructions (Signed)

## 2018-03-10 NOTE — Progress Notes (Signed)
   Subjective: Patient is doing well this AM. Abdominal pain has resolved. He is tolerating PO intake. He feels that he is ready to go home. Did have a bowel movement today which he states was soft. Discussed that we feel he is ready   Objective: Vital signs in last 24 hours: Vitals:   03/09/18 1700 03/09/18 1711 03/09/18 2036 03/10/18 0452  BP:  (!) 138/92 138/87 128/84  Pulse:  (!) 59 (!) 58 (!) 51  Resp:  18 16 18   Temp:  98.8 F (37.1 C) 98.4 F (36.9 C) 98.3 F (36.8 C)  TempSrc:  Oral Oral Oral  SpO2:  100% 100% 100%  Weight: 83.3 kg     Height: 6\' 4"  (1.93 m)      General: Well nourished male in no acute distress Pulm: Good air movement with no wheezing or crackles  CV: RRR, no murmurs, no rubs  Abdomen: Active bowel sounds, soft, non-distended, no tenderness to palpation   Assessment/Plan:  Mr. Delford FieldWright presented with diffuse abdominal pain (worse on left side) and was found to have a leukocytosis and intussusception on CT.  His abdominal pain improved with Antiemetics and morphine.  His repeat CT with oral contrast showed improvement of his intussusception and surgery is not indicated at this time.  It is also reassuring that his abdominal pain has significantly improved and he does not show signs of ischemia.   Intussusception:  - Abdominal pain improved since admission  - Repeat CT with oral contrast yesterday with decreased size of intussusception  - General surgery consulted, no need for surgery at this point  - Will discharge home today   Leukocytosis: 2/2 N/V vs intussusception  - Down trending   Presumed sexually transmitted infection: Suggested by urinalysis and history of unprotected sex, penile discharge, and testicular pain. - S/p empiric treatment with Rocephin and azithromycin  Dispo: Anticipated discharge in approximately 0 day(s).   Levora DredgeHelberg, Sandar Krinke, MD 03/10/2018, 6:15 AM

## 2018-03-14 LAB — CULTURE, BLOOD (ROUTINE X 2)
Culture: NO GROWTH
Culture: NO GROWTH

## 2018-05-19 ENCOUNTER — Encounter (HOSPITAL_COMMUNITY): Payer: Self-pay

## 2018-05-19 ENCOUNTER — Other Ambulatory Visit: Payer: Self-pay

## 2018-05-19 ENCOUNTER — Emergency Department (HOSPITAL_COMMUNITY): Payer: Self-pay

## 2018-05-19 ENCOUNTER — Emergency Department (HOSPITAL_COMMUNITY)
Admission: EM | Admit: 2018-05-19 | Discharge: 2018-05-19 | Disposition: A | Discharge status: Home / Self Care | Payer: Self-pay | Attending: Emergency Medicine | Admitting: Emergency Medicine

## 2018-05-19 DIAGNOSIS — R197 Diarrhea, unspecified: Secondary | ICD-10-CM | POA: Insufficient documentation

## 2018-05-19 DIAGNOSIS — F1220 Cannabis dependence, uncomplicated: Secondary | ICD-10-CM | POA: Insufficient documentation

## 2018-05-19 DIAGNOSIS — R112 Nausea with vomiting, unspecified: Secondary | ICD-10-CM | POA: Insufficient documentation

## 2018-05-19 DIAGNOSIS — R1032 Left lower quadrant pain: Secondary | ICD-10-CM | POA: Insufficient documentation

## 2018-05-19 DIAGNOSIS — Z87891 Personal history of nicotine dependence: Secondary | ICD-10-CM | POA: Insufficient documentation

## 2018-05-19 DIAGNOSIS — R109 Unspecified abdominal pain: Secondary | ICD-10-CM

## 2018-05-19 DIAGNOSIS — R1012 Left upper quadrant pain: Secondary | ICD-10-CM | POA: Insufficient documentation

## 2018-05-19 LAB — COMPREHENSIVE METABOLIC PANEL
ALBUMIN: 4.3 g/dL (ref 3.5–5.0)
ALT: 30 U/L (ref 0–44)
AST: 30 U/L (ref 15–41)
Alkaline Phosphatase: 50 U/L (ref 38–126)
Anion gap: 10 (ref 5–15)
BUN: 16 mg/dL (ref 6–20)
CHLORIDE: 92 mmol/L — AB (ref 98–111)
CO2: 30 mmol/L (ref 22–32)
CREATININE: 1.1 mg/dL (ref 0.61–1.24)
Calcium: 8.9 mg/dL (ref 8.9–10.3)
GFR calc non Af Amer: >60 mL/min (ref >60)
GLUCOSE: 118 mg/dL — AB (ref 70–99)
Potassium: 3.3 mmol/L — ABNORMAL LOW (ref 3.5–5.1)
SODIUM: 132 mmol/L — AB (ref 135–145)
Total Bilirubin: 1.1 mg/dL (ref 0.3–1.2)
Total Protein: 7.2 g/dL (ref 6.5–8.1)

## 2018-05-19 LAB — CBC
HEMATOCRIT: 47.6 % (ref 39.0–52.0)
Hemoglobin: 16 g/dL (ref 13.0–17.0)
MCH: 31.4 pg (ref 26.0–34.0)
MCHC: 33.6 g/dL (ref 30.0–36.0)
MCV: 93.3 fL (ref 80.0–100.0)
NRBC: 0 % (ref 0.0–0.2)
Platelets: 253 10*3/uL (ref 150–400)
RBC: 5.1 MIL/uL (ref 4.22–5.81)
RDW: 12.7 % (ref 11.5–15.5)
WBC: 10.7 10*3/uL — ABNORMAL HIGH (ref 4.0–10.5)

## 2018-05-19 LAB — LIPASE, BLOOD: LIPASE: 35 U/L (ref 11–51)

## 2018-05-19 MED ORDER — SODIUM CHLORIDE (PF) 0.9 % IJ SOLN
INTRAMUSCULAR | Status: AC
  Filled 2018-05-19: qty 50

## 2018-05-19 MED ORDER — ONDANSETRON 4 MG PO TBDP: 4.0000 mg | ORAL_TABLET | Freq: Three times a day (TID) | ORAL | 0 refills | Status: DC | PRN

## 2018-05-19 MED ORDER — SODIUM CHLORIDE 0.9% FLUSH: 3.0000 mL | Freq: Once | INTRAVENOUS | Status: DC

## 2018-05-19 MED ORDER — IOPAMIDOL (ISOVUE-300) INJECTION 61%
100.0000 mL | Freq: Once | INTRAVENOUS | Status: AC | PRN
  Administered 2018-05-19: 100 mL via INTRAVENOUS

## 2018-05-19 MED ORDER — ONDANSETRON 4 MG PO TBDP
4.0000 mg | ORAL_TABLET | Freq: Once | ORAL | Status: AC
  Administered 2018-05-19: 4 mg via ORAL
  Filled 2018-05-19: qty 1

## 2018-05-19 MED ORDER — IOPAMIDOL (ISOVUE-300) INJECTION 61%
INTRAVENOUS | Status: AC
  Filled 2018-05-19: qty 100

## 2018-05-19 MED ORDER — DICYCLOMINE HCL 20 MG PO TABS: 20.0000 mg | ORAL_TABLET | Freq: Two times a day (BID) | ORAL | 0 refills | Status: DC

## 2018-05-19 MED ORDER — MORPHINE SULFATE (PF) 4 MG/ML IV SOLN
4.0000 mg | Freq: Once | INTRAVENOUS | Status: AC
  Administered 2018-05-19: 4 mg via INTRAVENOUS
  Filled 2018-05-19: qty 1

## 2018-05-19 MED ORDER — POTASSIUM CHLORIDE CRYS ER 20 MEQ PO TBCR
40.0000 meq | EXTENDED_RELEASE_TABLET | Freq: Once | ORAL | Status: AC
  Administered 2018-05-19: 40 meq via ORAL
  Filled 2018-05-19: qty 2

## 2018-05-19 MED ORDER — SODIUM CHLORIDE 0.9 % IV BOLUS
1000.0000 mL | Freq: Once | INTRAVENOUS | Status: AC
  Administered 2018-05-19: 1000 mL via INTRAVENOUS

## 2018-05-19 MED ORDER — KETOROLAC TROMETHAMINE 30 MG/ML IJ SOLN
30.0000 mg | Freq: Once | INTRAMUSCULAR | Status: AC
  Administered 2018-05-19: 30 mg via INTRAVENOUS
  Filled 2018-05-19: qty 1

## 2018-05-19 NOTE — ED Triage Notes (Signed)
Pt states that he is having left sided abdominal pain. Pt states he has had the same problem in the past, and it was intusussception. Pt states he has been nauseated.

## 2018-05-19 NOTE — ED Notes (Signed)
Patient transported to CT 

## 2018-05-19 NOTE — Discharge Instructions (Signed)
1. Medications: Take Bentyl as needed for abdominal cramping/pain. Alternate 600 mg of ibuprofen and [phone] mg of Tylenol every 3 hours as needed for pain. Do not exceed 4000 mg of Tylenol daily.  Take ibuprofen with food to avoid upset stomach.  Let this dissolve under your tongue. Take Zofran as needed for nausea.  Wait around 10-20 minutes before eating or drinking after taking this medication. 2. Treatment: rest, drink plenty of fluids, advance diet slowly.  Start with water and broth then advance to bland foods that will not upset your stomach such as crackers, mashed potatoes, and peanut butter. 3. Follow Up: Please followup with your primary doctor or a gastroenterologist in 3 days for discussion of your diagnoses and further evaluation after today's visit; Please return to the ER for persistent vomiting, high fevers or worsening symptoms

## 2018-05-19 NOTE — ED Provider Notes (Signed)
[facility]-EMERGENCY DEPT Provider Note   CSN: [id number] Arrival date & time: 05/19/18  1024     History   Chief Complaint Chief Complaint  Patient presents with  . Abdominal Pain    HPI Corey Atkinson is a 38 y.o. male with history of cannabis hyperemesis syndrome presents for evaluation of acute onset, progressively worsening left-sided abdominal pain for 4 days.  He reports pain is sharp, constant, no aggravating or alleviating factors noted.  He reports he had multiple episodes of nonbloody nonbilious emesis daily and has not been able to keep down any food since his symptoms began.  He also reports some watery nonbloody diarrhea.  Denies urinary symptoms.  Has not tried anything for his symptoms.  Currently requesting morphine.  The history is provided by the patient.    Past Medical History:  Diagnosis Date  . 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 Abdominal pain, diffuse 03/09/2018  . Nausea & vomiting 03/09/2018  . 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 No pertinent surgical history.      Home Medications    Prior to Admission medications   Medication Sig Start Date End Date Taking? Authorizing Provider  dicyclomine (BENTYL) 20 MG tablet Take 1 tablet (20 mg total) by mouth 2 (two) times daily. 05/19/18   Luevenia MaxinFawze, Aveer Bartow A, PA-C  ondansetron (ZOFRAN ODT) 4 MG disintegrating tablet Take 1 tablet (4 mg total) by mouth every 8 (eight) hours as needed for nausea or vomiting. 05/19/18   Jeanie SewerFawze, Aleyza Salmi A, PA-C    Family History No family history on file.  Social History Social History   Tobacco Use  . Smoking status: Former Smoker    Types: Cigarettes    Last attempt to quit: 11/12/2013    Years since quitting: 4.5  . Smokeless tobacco: Never Used    Substance Use Topics  . Alcohol use: No  . Drug use: No     Allergies   Patient has no known allergies.   Review of Systems Review of Systems  Constitutional: Negative for chills and fever.  Respiratory: Negative for shortness of breath.   Cardiovascular: Negative for chest pain.  Gastrointestinal: Positive for abdominal pain, diarrhea, nausea and vomiting. Negative for blood in stool.  Genitourinary: Negative for dysuria, frequency, hematuria and urgency.  All other systems reviewed and are negative.    Physical Exam Updated Vital Signs BP (!) 141/89   Pulse (!) 57   Temp 98.4 F (36.9 C) (Oral)   Resp 17   Ht 6\' 4"  (1.93 m)   Wt 83.9 kg   SpO2 100%   BMI 22.52 kg/m   Physical Exam Vitals signs and nursing note reviewed.  Constitutional:      General: He is not in acute distress.    Appearance: He is well-developed.  HENT:     Head: Normocephalic and atraumatic.  Eyes:     General:        Right eye: No discharge.        Left eye: No discharge.     Conjunctiva/sclera: Conjunctivae normal.  Neck:     Vascular: No JVD.     Trachea: No tracheal deviation.  Cardiovascular:     Rate and Rhythm: Normal rate.     Heart sounds: Normal heart sounds.  Pulmonary:  Effort: Pulmonary effort is normal.     Breath sounds: Normal breath sounds.  Abdominal:     General: Abdomen is scaphoid. Bowel sounds are normal. There is no distension.     Palpations: Abdomen is soft.     Tenderness: There is abdominal tenderness in the left upper quadrant and left lower quadrant. There is no right CVA tenderness, left CVA tenderness, guarding or rebound. Negative signs include Murphy's sign, Rovsing's sign, McBurney's sign, psoas sign and obturator sign.  Skin:    General: Skin is warm and dry.     Findings: No erythema.  Neurological:     Mental Status: He is alert.  Psychiatric:        Behavior: Behavior normal.      ED Treatments / Results  Labs (all labs ordered are  listed, but only abnormal results are displayed) Labs Reviewed  COMPREHENSIVE METABOLIC PANEL - Abnormal; Notable for the following components:      Result Value   Sodium 132 (*)    Potassium 3.3 (*)    Chloride 92 (*)    Glucose, Bld 118 (*)    All other components within normal limits  CBC - Abnormal; Notable for the following components:   WBC 10.7 (*)    All other components within normal limits  LIPASE, BLOOD    EKG None  Radiology Ct Abdomen Pelvis W Contrast  Result Date: 05/19/2018 CLINICAL DATA:  Left-sided abdominal pain and history of possible small bowel intussusception by CT. EXAM: CT ABDOMEN AND PELVIS WITH CONTRAST TECHNIQUE: Multidetector CT imaging of the abdomen and pelvis was performed using the standard protocol following bolus administration of intravenous contrast. CONTRAST:  ISOVUE-300 IOPAMIDOL (ISOVUE-300) INJECTION 61% COMPARISON:  03/09/2018 FINDINGS: Lower chest: No acute abnormality. Hepatobiliary: No focal liver abnormality is seen. No gallstones, gallbladder wall thickening, or biliary dilatation. Pancreas: Unremarkable. No pancreatic ductal dilatation or surrounding inflammatory changes. Spleen: Normal in size without focal abnormality. Adrenals/Urinary Tract: Adrenal glands are unremarkable. Kidneys are normal, without renal calculi, focal lesion, or hydronephrosis. Bladder is unremarkable. Stomach/Bowel: Evaluation of bowel is limited without ingestion of oral contrast. No evidence of bowel obstruction, ileus, inflammation or lesion. No convincing evidence on today's study small bowel intussusception or small bowel lesion. No free air or focal abscess identified. Vascular/Lymphatic: No significant vascular findings are present. No enlarged abdominal or pelvic lymph nodes. Reproductive: Prostate is unremarkable. Other: No abdominal wall hernia or abnormality. No abdominopelvic ascites. Musculoskeletal: No acute or significant osseous findings. IMPRESSION:  No acute findings. Although lack of oral contrast limits bowel evaluation, there is no convincing evidence on today's study of small bowel intussusception or small bowel lesion. Electronically Signed   By: Irish Lack M.D.   On: 05/19/2018 12:59    Procedures Procedures (including critical care time)  Medications Ordered in ED Medications  sodium chloride flush (NS) 0.9 % injection 3 mL (3 mLs Intravenous Not Given 05/19/18 1107)  iopamidol (ISOVUE-300) 61 % injection (  Hold 05/19/18 1218)  ondansetron (ZOFRAN-ODT) disintegrating tablet 4 mg (4 mg Oral Given 05/19/18 1040)  morphine 4 MG/ML injection 4 mg (4 mg Intravenous Given 05/19/18 1210)  sodium chloride 0.9 % bolus 1,000 mL (0 mLs Intravenous Stopped 05/19/18 1327)  iopamidol (ISOVUE-300) 61 % injection 100 mL (100 mLs Intravenous Contrast Given 05/19/18 1220)  potassium chloride SA (K-DUR,KLOR-CON) CR tablet 40 mEq (40 mEq Oral Given 05/19/18 1349)  ketorolac (TORADOL) 30 MG/ML injection 30 mg (30 mg Intravenous Given 05/19/18 1446)  Initial Impression / Assessment and Plan / ED Course  I have reviewed the triage vital signs and the nursing notes.  Pertinent labs & imaging results that were available during my care of the patient were reviewed by me and considered in my medical decision making (see chart for details).     Patient presenting for evaluation of abdominal pain with nausea vomiting and diarrhea.  He is afebrile, vital signs are stable.  He is nontoxic in appearance.  No peritoneal signs on examination of the abdomen.  Lab work reviewed by me shows mild nonspecific leukocytosis, no anemia, mild hypokalemia.  No metabolic derangements or renal insufficiency.  LFTs and lipase within normal limits.  CT scan of the abdomen and pelvis shows no evidence of intussusception or other acute surgical abdominal pathology.  While in the ED, patient has had no nausea, vomiting, or diarrhea.  Serial abdominal examinations remain benign.   He was given p.o. potassium and is tolerating p.o. fluids on reevaluation.  No evidence of obstruction, perforation, appendicitis, cholecystitis, or dissection.  Discussed advancing diet slowly, pushing fluids.  Will discharge with Zofran and Bentyl.  Recommend follow-up with PCP or gastroenterology for reevaluation of symptoms.  Discussed strict ED return precautions. Pt verbalized understanding of and agreement with plan and is safe for discharge home at this time.   Final Clinical Impressions(s) / ED Diagnoses   Final diagnoses:  Left sided abdominal pain  Nausea vomiting and diarrhea    ED Discharge Orders         Ordered    ondansetron (ZOFRAN ODT) 4 MG disintegrating tablet  Every 8 hours PRN     05/19/18 1436    dicyclomine (BENTYL) 20 MG tablet  2 times daily     05/19/18 1436           Chawn Spraggins, LoaMina A, PA-C 05/19/18 1650    Melene PlanFloyd, Dan, DO 05/20/18 1317

## 2018-05-21 ENCOUNTER — Other Ambulatory Visit: Payer: Self-pay

## 2018-05-21 ENCOUNTER — Emergency Department (HOSPITAL_COMMUNITY)
Admission: EM | Admit: 2018-05-21 | Discharge: 2018-05-22 | Disposition: A | Discharge status: Home / Self Care | Payer: Self-pay | Attending: Emergency Medicine | Admitting: Emergency Medicine

## 2018-05-21 DIAGNOSIS — R1012 Left upper quadrant pain: Secondary | ICD-10-CM | POA: Insufficient documentation

## 2018-05-21 DIAGNOSIS — Z87891 Personal history of nicotine dependence: Secondary | ICD-10-CM | POA: Insufficient documentation

## 2018-05-21 DIAGNOSIS — R112 Nausea with vomiting, unspecified: Secondary | ICD-10-CM | POA: Insufficient documentation

## 2018-05-21 LAB — CBC
HCT: 45.6 % (ref 39.0–52.0)
Hemoglobin: 15.6 g/dL (ref 13.0–17.0)
MCH: 31 pg (ref 26.0–34.0)
MCHC: 34.2 g/dL (ref 30.0–36.0)
MCV: 90.7 fL (ref 80.0–100.0)
Platelets: 291 10*3/uL (ref 150–400)
RBC: 5.03 MIL/uL (ref 4.22–5.81)
RDW: 12.6 % (ref 11.5–15.5)
WBC: 12.8 10*3/uL — ABNORMAL HIGH (ref 4.0–10.5)
nRBC: 0 % (ref 0.0–0.2)

## 2018-05-21 LAB — LIPASE, BLOOD: Lipase: 27 U/L (ref 11–51)

## 2018-05-21 LAB — URINALYSIS, ROUTINE W REFLEX MICROSCOPIC
Bilirubin Urine: NEGATIVE
GLUCOSE, UA: NEGATIVE mg/dL
Ketones, ur: NEGATIVE mg/dL
Nitrite: NEGATIVE
Protein, ur: NEGATIVE mg/dL
Specific Gravity, Urine: 1.021 (ref 1.005–1.030)
pH: 7 (ref 5.0–8.0)

## 2018-05-21 LAB — COMPREHENSIVE METABOLIC PANEL
ALBUMIN: 4.5 g/dL (ref 3.5–5.0)
ALK PHOS: 51 U/L (ref 38–126)
ALT: 23 U/L (ref 0–44)
AST: 25 U/L (ref 15–41)
Anion gap: 11 (ref 5–15)
BUN: 10 mg/dL (ref 6–20)
CALCIUM: 9.7 mg/dL (ref 8.9–10.3)
CO2: 27 mmol/L (ref 22–32)
CREATININE: 1.17 mg/dL (ref 0.61–1.24)
Chloride: 97 mmol/L — ABNORMAL LOW (ref 98–111)
GFR calc Af Amer: >60 mL/min (ref >60)
GFR calc non Af Amer: >60 mL/min (ref >60)
GLUCOSE: 117 mg/dL — AB (ref 70–99)
Potassium: 3.6 mmol/L (ref 3.5–5.1)
SODIUM: 135 mmol/L (ref 135–145)
Total Bilirubin: 0.9 mg/dL (ref 0.3–1.2)
Total Protein: 8 g/dL (ref 6.5–8.1)

## 2018-05-21 MED ORDER — SODIUM CHLORIDE 0.9% FLUSH: 3.0000 mL | Freq: Once | INTRAVENOUS | Status: DC

## 2018-05-21 NOTE — ED Triage Notes (Signed)
Patient c/o abd pain for 5-6 days and thinks he has intusussception. Was seen a couple of days ago for same at [facility].

## 2018-05-22 MED ORDER — SODIUM CHLORIDE 0.9 % IV BOLUS
1000.0000 mL | Freq: Once | INTRAVENOUS | Status: AC
  Administered 2018-05-22: 1000 mL via INTRAVENOUS

## 2018-05-22 MED ORDER — IBUPROFEN 400 MG PO TABS
400.0000 mg | ORAL_TABLET | Freq: Once | ORAL | Status: AC | PRN
  Administered 2018-05-22: 400 mg via ORAL
  Filled 2018-05-22: qty 1

## 2018-05-22 MED ORDER — MORPHINE SULFATE (PF) 4 MG/ML IV SOLN
4.0000 mg | Freq: Once | INTRAVENOUS | Status: AC
  Administered 2018-05-22: 4 mg via INTRAVENOUS
  Filled 2018-05-22: qty 1

## 2018-05-22 MED ORDER — ONDANSETRON HCL 4 MG/2ML IJ SOLN
4.0000 mg | Freq: Once | INTRAMUSCULAR | Status: AC
  Administered 2018-05-22: 4 mg via INTRAVENOUS
  Filled 2018-05-22: qty 2

## 2018-05-22 MED ORDER — ONDANSETRON 4 MG PO TBDP: 4.0000 mg | ORAL_TABLET | Freq: Three times a day (TID) | ORAL | 0 refills | Status: DC | PRN

## 2018-05-22 MED ORDER — ONDANSETRON 4 MG PO TBDP
4.0000 mg | ORAL_TABLET | Freq: Once | ORAL | Status: AC | PRN
  Administered 2018-05-22: 4 mg via ORAL
  Filled 2018-05-22: qty 1

## 2018-05-22 NOTE — Discharge Instructions (Signed)
Please read and follow all provided instructions.  Your diagnoses today include:  1. Non-intractable vomiting with nausea, unspecified vomiting type   2. Left upper quadrant pain     Tests performed today include:  Blood counts and electrolytes  Blood tests to check liver and kidney function  Blood tests to check pancreas function  Urine test to look for infection and pregnancy (in women)  Vital signs. See below for your results today.   Medications prescribed:   Zofran (ondansetron) - for nausea and vomiting  Take any prescribed medications only as directed.  Home care instructions:   Follow any educational materials contained in this packet.  Follow-up instructions: Please follow-up with your primary care provider or the stomach doctor listed in the next 5 days for further evaluation of your symptoms.    Return instructions:  SEEK IMMEDIATE MEDICAL ATTENTION IF:  The pain does not go away or becomes severe   A temperature above 101F develops   Repeated vomiting occurs (multiple episodes)   The pain becomes localized to portions of the abdomen. The right side could possibly be appendicitis. In an adult, the left lower portion of the abdomen could be colitis or diverticulitis.   Blood is being passed in stools or vomit (bright red or black tarry stools)   You develop chest pain, difficulty breathing, dizziness or fainting, or become confused, poorly responsive, or inconsolable (young children)  If you have any other emergent concerns regarding your health  Additional Information: Abdominal (belly) pain can be caused by many things. Your caregiver performed an examination and possibly ordered blood/urine tests and imaging (CT scan, x-rays, ultrasound). Many cases can be observed and treated at home after initial evaluation in the emergency department. Even though you are being discharged home, abdominal pain can be unpredictable. Therefore, you need a repeated exam if  your pain does not resolve, returns, or worsens. Most patients with abdominal pain don't have to be admitted to the hospital or have surgery, but serious problems like appendicitis and gallbladder attacks can start out as nonspecific pain. Many abdominal conditions cannot be diagnosed in one visit, so follow-up evaluations are very important.  Your vital signs today were: BP 138/83    Pulse (!) 53    Temp 99.9 F (37.7 C) (Oral)    Resp 16    Wt 83.9 kg    SpO2 97%    BMI 22.52 kg/m  If your blood pressure (bp) was elevated above 135/85 this visit, please have this repeated by your doctor within one month. --------------

## 2018-05-22 NOTE — ED Provider Notes (Signed)
[facility]   CSN: [id number] Arrival date & time: 05/21/18  2119     History   Chief Complaint Chief Complaint  Patient presents with  . Abdominal Pain    HPI Corey Atkinson is a 38 y.o. male.  Patient with no past surgical history presents the emergency department with ongoing abdominal pain over the past week.  [facility] days ago and had a CT scan which was negative.  Patient reports occasional marijuana use.  Patient reports left-sided abdominal pain, waxing and waning, with associated vomiting.  This is the same pain patient had in November 2019 when he was admitted for possible intussusception.  He was observed and this appeared to resolve on repeat imaging.  He did not ever have surgery for this.  Patient has not followed up with anyone as an outpatient.  States that he has had persistent vomiting last week.  Symptoms are not improved with Bentyl and Zofran at home.  Denies heavy NSAID use or alcohol use.  The onset of this condition was acute. The course is constant. Aggravating factors: none. Alleviating factors: none.       Past Medical History:  Diagnosis Date  . 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 Abdominal pain, diffuse 03/09/2018  . Nausea & vomiting 03/09/2018  . Cannabis hyperemesis syndrome concurrent with and due to cannabis dependence (HCC) 03/09/2018    No past surgical history on file.      Home Medications    Prior to Admission medications   Medication Sig Start Date End Date Taking? Authorizing Provider  dicyclomine (BENTYL) 20 MG tablet Take 1 tablet (20 mg total) by mouth 2 (two) times daily. 05/19/18   Luevenia MaxinFawze, Mina A, PA-C  ondansetron (ZOFRAN ODT) 4 MG disintegrating tablet Take 1 tablet (4 mg total) by mouth every 8 (eight) hours as needed for nausea  or vomiting. 05/19/18   Jeanie SewerFawze, Mina A, PA-C    Family History No family history on file.  Social History Social History   Tobacco Use  . Smoking status: Former Smoker    Types: Cigarettes    Last attempt to quit: 11/12/2013    Years since quitting: 4.5  . Smokeless tobacco: Never Used  Substance Use Topics  . Alcohol use: No  . Drug use: No     Allergies   Patient has no known allergies.   Review of Systems Review of Systems  Constitutional: Negative for appetite change and fever.  HENT: Negative for rhinorrhea and sore throat.   Eyes: Negative for redness.  Respiratory: Negative for cough and shortness of breath.   Cardiovascular: Negative for chest pain.  Gastrointestinal: Positive for abdominal pain, nausea and vomiting. Negative for blood in stool and diarrhea.       Negative for hematemesis  Genitourinary: Negative for dysuria.  Musculoskeletal: Negative for myalgias.  Skin: Negative for rash.  Neurological: Negative for light-headedness.     Physical Exam Updated Vital Signs BP (!) 108/91 (BP Location: Right Arm)   Pulse 63   Temp 99.9 F (37.7 C) (Oral)   Resp 19   Wt 83.9 kg   SpO2 100%   BMI 22.52 kg/m   Physical Exam Vitals signs and nursing note reviewed.  Constitutional:      Appearance: He is well-developed.  HENT:  Head: Normocephalic and atraumatic.  Eyes:     General:        Right eye: No discharge.        Left eye: No discharge.     Conjunctiva/sclera: Conjunctivae normal.  Neck:     Musculoskeletal: Normal range of motion and neck supple.  Cardiovascular:     Rate and Rhythm: Normal rate and regular rhythm.     Heart sounds: Normal heart sounds.  Pulmonary:     Effort: Pulmonary effort is normal.     Breath sounds: Normal breath sounds.  Abdominal:     Palpations: Abdomen is soft.     Tenderness: There is generalized abdominal tenderness (mild). There is no guarding or rebound.  Skin:    General: Skin is warm and dry.    Neurological:     Mental Status: He is alert.      ED Treatments / Results  Labs (all labs ordered are listed, but only abnormal results are displayed) Labs Reviewed  COMPREHENSIVE METABOLIC PANEL - Abnormal; Notable for the following components:      Result Value   Chloride 97 (*)    Glucose, Bld 117 (*)    All other components within normal limits  CBC - Abnormal; Notable for the following components:   WBC 12.8 (*)    All other components within normal limits  URINALYSIS, ROUTINE W REFLEX MICROSCOPIC - Abnormal; Notable for the following components:   APPearance CLOUDY (*)    Hgb urine dipstick SMALL (*)    Leukocytes, UA TRACE (*)    Bacteria, UA RARE (*)    All other components within normal limits  LIPASE, BLOOD    EKG EKG Interpretation  Date/Time:  Tuesday May 21 2018 22:09:30 EST Ventricular Rate:  67 PR Interval:  138 QRS Duration: 98 QT Interval:  404 QTC Calculation: 426 R Axis:   77 Text Interpretation:  Normal sinus rhythm Septal infarct , age undetermined Abnormal ECG No significant change since last tracing Confirmed by Drema Pryardama, Pedro [phone]([id number]) on 05/22/2018 5:28:43 AM   Radiology No results found.  Procedures Procedures (including critical care time)  Medications Ordered in ED Medications  sodium chloride flush (NS) 0.9 % injection 3 mL (has no administration in time range)  morphine 4 MG/ML injection 4 mg (has no administration in time range)  ondansetron (ZOFRAN) injection 4 mg (has no administration in time range)  sodium chloride 0.9 % bolus 1,000 mL (has no administration in time range)  ibuprofen (ADVIL,MOTRIN) tablet 400 mg (400 mg Oral Given 05/22/18 0303)  ondansetron (ZOFRAN-ODT) disintegrating tablet 4 mg (4 mg Oral Given 05/22/18 0303)     Initial Impression / Assessment and Plan / ED Course  I have reviewed the triage vital signs and the nursing notes.  Pertinent labs & imaging results that were available during my care of the  patient were reviewed by me and considered in my medical decision making (see chart for details).     Patient seen and examined.  Reviewed previous ED visits and previous imaging.  Patient appears well today.  No focal pain on exam.  Mild leukocytosis however similar to previous.  Will try to control pain, give fluids, fluid challenge.  Vital signs reviewed and are as follows: BP (!) 141/89   Pulse (!) 56   Temp 99.9 F (37.7 C) (Oral)   Resp 19   Wt 83.9 kg   SpO2 98%   BMI 22.52 kg/m   8:05 AM symptoms are improved  with treatments.  On reexam, abdomen is nontender.  Patient is drinking fluids at bedside.  No further vomiting.  We will discharged home with Zofran, GI referral.  Counseled on use of antiemetics, clear liquids for the next 24 hours, bland diet after that.  The patient was urged to return to the Emergency Department immediately with worsening of current symptoms, worsening abdominal pain, persistent vomiting, blood noted in stools, fever, or any other concerns. The patient verbalized understanding.    Final Clinical Impressions(s) / ED Diagnoses   Final diagnoses:  Non-intractable vomiting with nausea, unspecified vomiting type  Left upper quadrant pain   N/V and abdominal pain.  Vitals are stable, no fever. Labs in line with patient's baseline. Imaging not felt indicated today, CT scan performed 2 days ago was negative. No signs of dehydration, patient is tolerating PO's.  He would benefit from GI follow-up.  Lungs are clear and no signs suggestive of PNA. Low concern for appendicitis, cholecystitis, pancreatitis, ruptured viscus, UTI, kidney stone, aortic dissection, aortic aneurysm or other emergent abdominal etiology. Supportive therapy indicated with return if symptoms worsen.    ED Discharge Orders         Ordered    ondansetron (ZOFRAN ODT) 4 MG disintegrating tablet  Every 8 hours PRN     05/22/18 0803           Renne Crigler, PA-C 05/22/18 1610     Nira Conn, MD 05/23/18 [phone]

## 2018-05-22 NOTE — ED Notes (Signed)
Patient given apple juice

## 2018-05-22 NOTE — ED Notes (Signed)
Patient c/o intense abd pain and requests to be reassessed.

## 2018-05-22 NOTE — ED Notes (Signed)
PA Geiple at bedside.  

## 2018-09-01 ENCOUNTER — Encounter (HOSPITAL_COMMUNITY): Payer: Self-pay | Admitting: *Deleted

## 2018-09-01 ENCOUNTER — Other Ambulatory Visit: Payer: Self-pay

## 2018-09-01 ENCOUNTER — Emergency Department (HOSPITAL_COMMUNITY)
Admission: EM | Admit: 2018-09-01 | Discharge: 2018-09-01 | Disposition: A | Discharge status: Home / Self Care | Payer: Self-pay | Attending: Emergency Medicine | Admitting: Emergency Medicine

## 2018-09-01 DIAGNOSIS — R1013 Epigastric pain: Secondary | ICD-10-CM | POA: Insufficient documentation

## 2018-09-01 DIAGNOSIS — Z87891 Personal history of nicotine dependence: Secondary | ICD-10-CM | POA: Insufficient documentation

## 2018-09-01 DIAGNOSIS — R197 Diarrhea, unspecified: Secondary | ICD-10-CM

## 2018-09-01 DIAGNOSIS — R112 Nausea with vomiting, unspecified: Secondary | ICD-10-CM | POA: Insufficient documentation

## 2018-09-01 LAB — COMPREHENSIVE METABOLIC PANEL
ALT: 20 U/L (ref 0–44)
AST: 25 U/L (ref 15–41)
Albumin: 4.1 g/dL (ref 3.5–5.0)
Alkaline Phosphatase: 48 U/L (ref 38–126)
Anion gap: 10 (ref 5–15)
BUN: 12 mg/dL (ref 6–20)
CO2: 26 mmol/L (ref 22–32)
Calcium: 9.1 mg/dL (ref 8.9–10.3)
Chloride: 100 mmol/L (ref 98–111)
Creatinine, Ser: 0.97 mg/dL (ref 0.61–1.24)
GFR calc Af Amer: >60 mL/min (ref >60)
GFR calc non Af Amer: >60 mL/min (ref >60)
Glucose, Bld: 116 mg/dL — ABNORMAL HIGH (ref 70–99)
Potassium: 3.7 mmol/L (ref 3.5–5.1)
Sodium: 136 mmol/L (ref 135–145)
Total Bilirubin: 0.6 mg/dL (ref 0.3–1.2)
Total Protein: 6.9 g/dL (ref 6.5–8.1)

## 2018-09-01 LAB — URINALYSIS, ROUTINE W REFLEX MICROSCOPIC
Bacteria, UA: NONE SEEN
Bilirubin Urine: NEGATIVE
Glucose, UA: NEGATIVE mg/dL
Ketones, ur: NEGATIVE mg/dL
Nitrite: NEGATIVE
Protein, ur: NEGATIVE mg/dL
Specific Gravity, Urine: 1.024 (ref 1.005–1.030)
pH: 6 (ref 5.0–8.0)

## 2018-09-01 LAB — CBC
HCT: 44.6 % (ref 39.0–52.0)
Hemoglobin: 14.7 g/dL (ref 13.0–17.0)
MCH: 31.1 pg (ref 26.0–34.0)
MCHC: 33 g/dL (ref 30.0–36.0)
MCV: 94.5 fL (ref 80.0–100.0)
Platelets: 239 10*3/uL (ref 150–400)
RBC: 4.72 MIL/uL (ref 4.22–5.81)
RDW: 12.8 % (ref 11.5–15.5)
WBC: 11.8 10*3/uL — ABNORMAL HIGH (ref 4.0–10.5)
nRBC: 0 % (ref 0.0–0.2)

## 2018-09-01 LAB — LIPASE, BLOOD: Lipase: 26 U/L (ref 11–51)

## 2018-09-01 MED ORDER — ONDANSETRON 4 MG PO TBDP: 4.0000 mg | ORAL_TABLET | Freq: Three times a day (TID) | ORAL | 0 refills | Status: DC | PRN

## 2018-09-01 MED ORDER — DICYCLOMINE HCL 20 MG PO TABS: 20.0000 mg | ORAL_TABLET | Freq: Two times a day (BID) | ORAL | 0 refills | Status: DC

## 2018-09-01 MED ORDER — MORPHINE SULFATE (PF) 4 MG/ML IV SOLN
4.0000 mg | Freq: Once | INTRAVENOUS | Status: AC
  Administered 2018-09-01: 13:00:00 4 mg via INTRAVENOUS
  Filled 2018-09-01: qty 1

## 2018-09-01 MED ORDER — SODIUM CHLORIDE 0.9 % IV BOLUS
1000.0000 mL | Freq: Once | INTRAVENOUS | Status: AC
  Administered 2018-09-01: 1000 mL via INTRAVENOUS

## 2018-09-01 MED ORDER — SODIUM CHLORIDE 0.9% FLUSH: 3.0000 mL | Freq: Once | INTRAVENOUS | Status: DC

## 2018-09-01 NOTE — ED Notes (Signed)
Bed: WA17 Expected date:  Expected time:  Means of arrival:  Comments: 38 yo abd pain

## 2018-09-01 NOTE — Discharge Instructions (Addendum)
You have been seen today for nausea, vomiting, diarrhea, and abdominal pain. Please read and follow all provided instructions.   1. Medications: bentyl for diarrhea, zofran for vomiting, usual home medications 2. Treatment: rest, drink plenty of fluids 3. Follow Up: Please follow up with gastroenterology. Please follow up with your primary doctor in 2 days for discussion of your diagnoses and further evaluation after today's visit; if you do not have a primary care doctor use the resource guide provided to find one; Please return to the ER for any new or worsening symptoms. Please obtain all of your results from medical records or have your doctors office obtain the results - share them with your doctor - you should be seen at your doctors office. Call today to arrange your follow up.   Take medications as prescribed. Please review all of the medicines and only take them if you do not have an allergy to them. Return to the emergency room for worsening condition or new concerning symptoms. Follow up with your regular doctor. If you don't have a regular doctor use one of the numbers below to establish a primary care doctor.  Please be aware that if you are taking birth control pills, taking other prescriptions, ESPECIALLY ANTIBIOTICS may make the birth control ineffective - if this is the case, either do not engage in sexual activity or use alternative methods of birth control such as condoms until you have finished the medicine and your family doctor says it is OK to restart them. If you are on a blood thinner such as COUMADIN, be aware that any other medicine that you take may cause the coumadin to either work too much, or not enough - you should have your coumadin level rechecked in next 7 days if this is the case.  ?  It is also a possibility that you have an allergic reaction to any of the medicines that you have been prescribed - Everybody reacts differently to medications and while MOST people have no  trouble with most medicines, you may have a reaction such as nausea, vomiting, rash, swelling, shortness of breath. If this is the case, please stop taking the medicine immediately and contact your physician.  ?  You should return to the ER if you develop severe or worsening symptoms.   Emergency Department Resource Guide 1) Find a Doctor and Pay Out of Pocket Although you won't have to find out who is covered by your insurance plan, it is a good idea to ask around and get recommendations. You will then need to call the office and see if the doctor you have chosen will accept you as a new patient and what types of options they offer for patients who are self-pay. Some doctors offer discounts or will set up payment plans for their patients who do not have insurance, but you will need to ask so you aren't surprised when you get to your appointment.  2) Contact Your Local Health Department Not all health departments have doctors that can see patients for sick visits, but many do, so it is worth a call to see if yours does. If you don't know where your local health department is, you can check in your phone book. The CDC also has a tool to help you locate your state's health department, and many state websites also have listings of all of their local health departments.  3) Find a Walk-in Clinic If your illness is not likely to be very severe or complicated,  you may want to try a walk in clinic. These are popping up all over the country in pharmacies, drugstores, and shopping centers. They're usually staffed by nurse practitioners or physician assistants that have been trained to treat common illnesses and complaints. They're usually fairly quick and inexpensive. However, if you have serious medical issues or chronic medical problems, these are probably not your best option.  No Primary Care Doctor: Call Health Connect at  [phone] - they can help you locate a primary care doctor that  accepts your  insurance, provides certain services, etc. Physician Referral Service- [phone]  Chronic Pain Problems: Organization         Address  Phone   Notes  [facility]  [phone] Patients need to be referred by their primary care doctor.   Medication Assistance: Organization         Address  Phone   Notes  [facility] Medication [facility] [address] [place], [place] [address] [phone] --Must be a resident of [facility] -- Must have NO insurance coverage whatsoever (no Medicaid/ Medicare, etc.) -- The pt. MUST have a primary care doctor that directs their care regularly and follows them in the community   [facility]  [phone]   [employer]  [phone]    Agencies that provide inexpensive medical care: Organization         Address  Phone   Notes  [place]  [phone]   [facility]    [phone]   [facility] [place], [place] [address] [phone]   [place] [address] [place] [phone]   Planned Parenthood    [phone]   [facility]    [phone]   [place] and [place] [address], [place] Phone:  [phone], Fax:  [phone] Hours of Operation:  9 am - 6 pm, M-F.  Also accepts Medicaid/Medicare and self-pay.  [facility] for [place], [address], [place] Phone: [phone], Fax: [phone]. Hours of Operation:  8:30 am - 5:30 pm, M-F.  Also accepts Medicaid and self-pay.  [facility] [place] [address] [place] Phone: [phone]   [place], [place], [place] [phone], Ext. [phone] Mondays & Thursdays: 7-9 AM.  First 15 patients are seen on a first come, first serve basis.    [place] Providers:  Organization         Address  Phone    Notes  [facility] [address], [place] [phone] Also accepts self-pay patients.  [facility] [address], [place]  [phone]   [place], [address], [place] [phone]   [facility] [address] [place] [phone]   Lucianne Lei [address], [place]   [phone] Only accepts [place] [facility] [place] patients after they have their name applied to their card.   Self-Pay (no insurance) in [facility]:  Organization         Address  Phone   Notes  Sickle Cell Patients, [facility] [place] [phone]   [facility] [place] [phone])  [facility] [place]  [address], [address], [place] [phone]   [facility]/Dr. Osei-Bonsu  [address] [place] or [address], [place] [phone] Phone number for both [place] and [place] locations is the same.  [facility] and [facility] [address], [place] [phone]   [facility] [address] [place] or [address] [phone]   [facility] [address], [place] [phone], phone; [phone], fax Sees patients 1st and 3rd Saturday of every month.  Must not qualify for public or private insurance (i.e. Medicaid, Medicare,  Health Choice, Veterans' Benefits)  Household income should be no more than 200% of the poverty level The clinic cannot treat you if you are pregnant or think you are pregnant  Sexually transmitted diseases are not treated at the clinic.

## 2018-09-01 NOTE — ED Notes (Signed)
Pt became upset when he was told he was being discharged. He refused to allow [place] NT to remove the IV. Writer spoke with provided 2 times regarding pt stating he is hurting and demands pain meds prior to going . 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 He is demanding everyone's names, stating we did not do anything for him as far as finding out the cause of his pain. Labs were within normal range. He was instructed as always when he comes in with abd Pain to follow up with GI. Due to pts behavior security contacted and pt escorted off premises.

## 2018-09-01 NOTE — ED Triage Notes (Addendum)
Pt c/o abd pain with vomiting since Friday, fever 101.3 by EMS treated with Tylenol 1000mg  given and 4 IV Zofran. Pt states he is having a gallbladder pain, located mid abd and left abd. States he has diarrhea but has not had a stool in a couple of days.

## 2018-09-01 NOTE — ED Provider Notes (Signed)
[facility]-EMERGENCY DEPT Provider Note   CSN: [id number] Arrival date & time: 09/01/18  1206  History   Chief Complaint Chief Complaint  Patient presents with  . Abdominal Pain  . Fever    HPI Corey Atkinson is a 38 y.o. male with a PMH of cannabis hyperemesis syndrome presents with constant epigastric abdominal pain onset 3 days ago. Patient arrived via EMS. EMS provided tylenol and zofran. Patient denies taking any other medications. Patient reports nothing makes symptoms better or worse. Patient describes pain as sharp and non radiating. Patient states he has had 2 episodes of non bilious non bloody vomiting and 1 episode of non bloody diarrhea in the last 24 hours. Patient states last episode of diarrhea was this morning. Patient denies fever and states he was unaware of a fever. Patient denies cough, shortness of breath, chest pain, congestion, rhinorrhea, sore throat, or headache. Patient reports loss of appetite and states he has not been drinking fluids. Patient denies drug use and states he has not used marijuana in 90 days. Patient denies tobacco or alcohol use. Patient reports he has had similar episodes of abdominal pain in the past. Patient denies any abdominal surgeries in the past.      HPI  Past Medical History:  Diagnosis Date  . 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 Abdominal pain, diffuse 03/09/2018  . Nausea & vomiting 03/09/2018  . 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 No pertinent surgical history.      Home Medications    Prior to Admission medications   Medication Sig Start Date End Date Taking? Authorizing Provider  dicyclomine (BENTYL) 20 MG tablet Take 1 tablet (20 mg total) by mouth 2 (two) times daily. 09/01/18   Carlyle Basques P, PA-C   ondansetron (ZOFRAN ODT) 4 MG disintegrating tablet Take 1 tablet (4 mg total) by mouth every 8 (eight) hours as needed for nausea or vomiting. 09/01/18   Leretha Dykes, PA-C    Family History No family history on file.  Social History Social History   Tobacco Use  . Smoking status: Former Smoker    Types: Cigarettes    Last attempt to quit: 11/12/2013    Years since quitting: 4.8  . Smokeless tobacco: Never Used  Substance Use Topics  . Alcohol use: No  . Drug use: Yes    Types: Marijuana     Allergies   Patient has no known allergies.   Review of Systems Review of Systems  Constitutional: Positive for appetite change. Negative for activity change, chills, fever and unexpected weight change.  HENT: Negative for congestion, rhinorrhea and sore throat.   Eyes: Negative for visual disturbance.  Respiratory: Negative for cough and shortness of breath.   Cardiovascular: Negative for chest pain.  Gastrointestinal: Positive for abdominal pain, diarrhea, nausea and vomiting. Negative for constipation.  Endocrine: Negative for polydipsia, polyphagia and polyuria.  Genitourinary: Negative for dysuria, flank pain and frequency.  Musculoskeletal: Negative for back pain and myalgias.  Skin: Negative for rash.  Allergic/Immunologic: Negative for immunocompromised state.  Neurological: Negative for weakness, numbness and headaches.  Psychiatric/Behavioral: The patient is not nervous/anxious.     Physical Exam Updated Vital Signs BP 118/80   Pulse 69   Temp 99.5 F (37.5 C) (Rectal)   Resp 16   Ht  6\' 4"  (1.93 m)   Wt 83.9 kg   SpO2 100%   BMI 22.52 kg/m   Physical Exam Vitals signs and nursing note reviewed.  Constitutional:      General: He is not in acute distress.    Appearance: He is well-developed. He is not diaphoretic.     Comments: Patient appears uncomfortable during exam due to abdominal pain. No emesis while taking history or during physical exam.  HENT:      Head: Normocephalic and atraumatic.     Mouth/Throat:     Mouth: Mucous membranes are moist.     Pharynx: Oropharynx is clear. No oropharyngeal exudate.  Eyes:     General: No scleral icterus.    Extraocular Movements: Extraocular movements intact.     Pupils: Pupils are equal, round, and reactive to light.  Neck:     Musculoskeletal: Normal range of motion and neck supple.  Cardiovascular:     Rate and Rhythm: Normal rate and regular rhythm.     Heart sounds: Normal heart sounds. No murmur. No friction rub. No gallop.   Pulmonary:     Effort: Pulmonary effort is normal. No respiratory distress.     Breath sounds: Normal breath sounds. No wheezing or rales.  Abdominal:     General: Abdomen is flat. Bowel sounds are normal. There is no distension.     Palpations: Abdomen is soft. Abdomen is not rigid. There is no mass.     Tenderness: There is abdominal tenderness in the epigastric area. There is no right CVA tenderness, left CVA tenderness, guarding or rebound. Negative signs include McBurney's sign.     Hernia: No hernia is present.  Musculoskeletal: Normal range of motion.  Skin:    General: Skin is warm.     Findings: No rash.  Neurological:     Mental Status: He is alert and oriented to person, place, and time.    ED Treatments / Results  Labs (all labs ordered are listed, but only abnormal results are displayed) Labs Reviewed  COMPREHENSIVE METABOLIC PANEL - Abnormal; Notable for the following components:      Result Value   Glucose, Bld 116 (*)    All other components within normal limits  CBC - Abnormal; Notable for the following components:   WBC 11.8 (*)    All other components within normal limits  URINALYSIS, ROUTINE W REFLEX MICROSCOPIC - Abnormal; Notable for the following components:   Hgb urine dipstick SMALL (*)    Leukocytes,Ua TRACE (*)    All other components within normal limits  URINE CULTURE  LIPASE, BLOOD    EKG None  Radiology No results  found.  Procedures Procedures (including critical care time)  Medications Ordered in ED Medications  sodium chloride flush (NS) 0.9 % injection 3 mL (3 mLs Intravenous Not Given 09/01/18 1231)  sodium chloride 0.9 % bolus 1,000 mL (1,000 mLs Intravenous New Bag/Given 09/01/18 1240)  morphine 4 MG/ML injection 4 mg (4 mg Intravenous Given 09/01/18 1243)   Initial Impression / Assessment and Plan / ED Course  I have reviewed the triage vital signs and the nursing notes.  Pertinent labs & imaging results that were available during my care of the patient were reviewed by me and considered in my medical decision making (see chart for details).  Clinical Course as of Aug 31 1413  Sun Sep 01, 2018  1353 Leukocytosis noted at 11.8.   WBC(!): 11.8 [AH]  1354 CMP is within normal limits  except mild hyperglycemia noted at 116.  Comprehensive metabolic panel(!) [AH]  1354 Lipase within normal limits.  Lipase, blood [AH]  1354 Small Hgb, trace leukocytes, and RBCs noted on UA. Will order urine culture.  Leukocytes,Ua(!): TRACE [AH]  1410 Patient states symptoms have completely resolved. Patient has not had any nausea or vomiting while in the ER.   [AH]    Clinical Course User Index [AH] Leretha Dykes, PA-C      Patient presents with nausea, vomiting, diarrhea, and abdominal pain. Patient is nontoxic, nonseptic appearing, in no apparent distress.  Patient's pain and other symptoms adequately managed in emergency department.  Fluid bolus given. Zofran and tylenol given by EMS prior to arrival. Labs, imaging and vitals reviewed.  Patient does not meet the SIRS or Sepsis criteria.  On repeat exam patient does not have a surgical abdomin and there are no peritoneal signs.  No indication of appendicitis, bowel obstruction, bowel perforation, cholecystitis, or diverticulitis.  Patient is able to tolerate fluids without difficulty. Pain completely resolved upon reassessment. Patient was sleeping in no  acute distress. Discussed discharge instructions with patient and patient agreed with plan.  Patient requested additional pain medicine prior to discharge, but denied any additional pain. Upon reassessment, patient continues to not have any abdominal pain. Discussed with patient that we will not provide pain medicine if pain has resolved. Patient discharged home with symptomatic treatment and given strict instructions for follow-up with their primary care physician and GI due to recurrence of symptoms.  I have also discussed reasons to return immediately to the ER.  Patient expresses understanding and agrees with plan.   Final Clinical Impressions(s) / ED Diagnoses   Final diagnoses:  Nausea vomiting and diarrhea  Epigastric pain    ED Discharge Orders         Ordered    ondansetron (ZOFRAN ODT) 4 MG disintegrating tablet  Every 8 hours PRN     09/01/18 1414    dicyclomine (BENTYL) 20 MG tablet  2 times daily     09/01/18 [address] [place], [place] 09/01/18 1422    Linwood Dibbles, MD 09/02/18 1212

## 2018-09-02 LAB — URINE CULTURE: Culture: NO GROWTH

## 2018-10-23 ENCOUNTER — Encounter (HOSPITAL_COMMUNITY): Payer: Self-pay | Admitting: Emergency Medicine

## 2018-10-23 ENCOUNTER — Other Ambulatory Visit: Payer: Self-pay

## 2018-10-23 ENCOUNTER — Ambulatory Visit (HOSPITAL_COMMUNITY)
Admission: EM | Admit: 2018-10-23 | Discharge: 2018-10-23 | Disposition: A | Discharge status: Home / Self Care | Payer: Self-pay | Attending: Internal Medicine | Admitting: Internal Medicine

## 2018-10-23 DIAGNOSIS — A059 Bacterial foodborne intoxication, unspecified: Secondary | ICD-10-CM

## 2018-10-23 MED ORDER — ONDANSETRON HCL 4 MG/2ML IJ SOLN
4.0000 mg | Freq: Once | INTRAMUSCULAR | Status: AC
  Administered 2018-10-23: 4 mg via INTRAMUSCULAR

## 2018-10-23 MED ORDER — ONDANSETRON 4 MG PO TBDP: 4.0000 mg | ORAL_TABLET | Freq: Three times a day (TID) | ORAL | 0 refills | Status: DC | PRN

## 2018-10-23 MED ORDER — KETOROLAC TROMETHAMINE 30 MG/ML IJ SOLN
30.0000 mg | Freq: Once | INTRAMUSCULAR | Status: AC
  Administered 2018-10-23: 30 mg via INTRAMUSCULAR

## 2018-10-23 MED ORDER — DICYCLOMINE HCL 20 MG PO TABS: 20.0000 mg | ORAL_TABLET | Freq: Two times a day (BID) | ORAL | 0 refills | Status: DC

## 2018-10-23 MED ORDER — KETOROLAC TROMETHAMINE 30 MG/ML IJ SOLN
INTRAMUSCULAR | Status: AC
  Filled 2018-10-23: qty 1

## 2018-10-23 MED ORDER — PANTOPRAZOLE SODIUM 20 MG PO TBEC: 20.0000 mg | DELAYED_RELEASE_TABLET | Freq: Every day | ORAL | 0 refills | Status: DC

## 2018-10-23 MED ORDER — ONDANSETRON 4 MG PO TBDP
ORAL_TABLET | ORAL | Status: AC
  Filled 2018-10-23: qty 1

## 2018-10-23 NOTE — ED Provider Notes (Addendum)
Corey Atkinson    CSN: [id number] Arrival date & time: 10/23/18  0109      History   Chief Complaint No chief complaint on file.   HPI Corey Atkinson is a 38 y.o. male a history of cannabis hyperemesis syndrome comes to urgent care with complaints of crampy abdominal pain, nausea and vomiting as well as diarrhea of 1 day duration.  Patient ate a chicken salad prior to the onset of the symptoms.  Abdominal pain is severe, constant and crampy.  It is associated with nonbloody vomiting.  He has had watery stools last night.  This morning the patient has had one episode of vomiting and one episode of loose bowel movements.  No abdominal distention.  He had some dry heaving.  No fever or chills.  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     Home Medications    Prior to Admission medications   Medication Sig Start Date End Date Taking? Authorizing Provider  dicyclomine (BENTYL) 20 MG tablet Take 1 tablet (20 mg total) by mouth 2 (two) times daily. 09/01/18   Darlin Drop P, PA-C  ondansetron (ZOFRAN ODT) 4 MG disintegrating tablet Take 1 tablet (4 mg total) by mouth every 8 (eight) hours as needed for nausea or vomiting. 09/01/18   Arville Lime, PA-C    Family History No family history on file.  Social History Social History   Tobacco Use   Smoking status: Former Smoker    Types: Cigarettes    Quit date: 11/12/2013    Years since quitting: 4.9   Smokeless tobacco: Never Used  Substance Use Topics   Alcohol use: No   Drug use: Yes    Types: Marijuana     Allergies   Patient has no known  allergies.   Review of Systems Review of Systems  Constitutional: Positive for activity change and appetite change. Negative for chills, diaphoresis and fatigue.  HENT: Negative.   Respiratory: Negative.   Cardiovascular: Negative.   Gastrointestinal: Positive for abdominal pain, diarrhea, nausea and vomiting. Negative for abdominal distention.  Genitourinary: Negative.   Musculoskeletal: Negative.   Skin: Negative.   Neurological: Negative for dizziness, weakness and light-headedness.  Psychiatric/Behavioral: Negative for behavioral problems, confusion and decreased concentration.     Physical Exam Triage Vital Signs ED Triage Vitals  Enc Vitals Group     BP      Pulse      Resp      Temp      Temp src      SpO2      Weight      Height      Head Circumference      Peak Flow      Pain Score      Pain Loc      Pain Edu?      Excl. in [place]?    No data found.  Updated Vital Signs There were no vitals taken for this visit.  Visual Acuity Right Eye Distance:   Left Eye Distance:   Bilateral Distance:    Right Eye Near:   Left Eye Near:  Bilateral Near:     Physical Exam Vitals signs and nursing note reviewed.  Constitutional:      General: He is in acute distress.     Appearance: He is well-developed. He is ill-appearing.  Cardiovascular:     Rate and Rhythm: Normal rate and regular rhythm.  Pulmonary:     Effort: Pulmonary effort is normal.     Breath sounds: Normal breath sounds.  Abdominal:     General: Abdomen is flat. Bowel sounds are normal.     Palpations: Abdomen is soft. There is no shifting dullness, fluid wave, hepatomegaly or mass.     Tenderness: There is no abdominal tenderness.     Hernia: No hernia is present.  Skin:    Capillary Refill: Capillary refill takes less than 2 seconds.  Neurological:     General: No focal deficit present.     Mental Status: He is alert.      UC Treatments / Results  Labs (all labs ordered are listed,  but only abnormal results are displayed) Labs Reviewed - No data to display  EKG   Radiology No results found.  Procedures Procedures (including critical care time)  Medications Ordered in UC Medications - No data to display  Initial Impression / Assessment and Plan / UC Course  I have reviewed the triage vital signs and the nursing notes.  Pertinent labs & imaging results that were available during my care of the patient were reviewed by me and considered in my medical decision making (see chart for details).    1.  Food poisoning with gastroenteritis: IM Toradol 30 mg given for pain IM Zofran 4 mg given Patient was discharged from the urgent care with Bentyl and ODT Zofran. Patient's abdominal exam was benign.  No guarding, rebound tenderness.  No signs of appendicitis or peritonitis.  No muscle rigidity.  On entering the room patient was lying on the examination bed.  After a few seconds of speaking with him he started flailing in the bed asking if I could give him pain medications before he will continue speaking with me.  I told him I needed to know his symptoms to inform me as to what kind of pain medication I can give him.  After he was given Zofran and Toradol, I went back to the room patient was resting comfortably and again once he saw me he started laying in the bed again asking if I could give him a shot of morphine instead because Toradol was not helping. I told him that Toradol will take care of the pain and that food poisoning typically improves in 24 hours.  If his abdominal pain worsens, he needs to go to the emergency department to be reevaluated. Final Clinical Impressions(s) / UC Diagnoses   Final diagnoses:  None   Discharge Instructions   None    ED Prescriptions    None     Controlled Substance Prescriptions [place] Controlled Substance Registry consulted? No   Merrilee JanskyLamptey, [place] O, MD 10/23/18 1114    Merrilee JanskyLamptey, Tiea Manninen O, MD 10/23/18 1115

## 2018-10-23 NOTE — ED Triage Notes (Signed)
PT reports generalized abdominal pain, nausea, vomiting, and diarrhea that started last night shortly after eating a chicken sandwich.

## 2018-11-06 ENCOUNTER — Encounter (HOSPITAL_COMMUNITY): Payer: Self-pay | Admitting: Emergency Medicine

## 2018-11-06 ENCOUNTER — Emergency Department (HOSPITAL_COMMUNITY)
Admission: EM | Admit: 2018-11-06 | Discharge: 2018-11-07 | Disposition: A | Discharge status: Home / Self Care | Payer: Self-pay | Attending: Emergency Medicine | Admitting: Emergency Medicine

## 2018-11-06 ENCOUNTER — Other Ambulatory Visit: Payer: Self-pay

## 2018-11-06 DIAGNOSIS — R319 Hematuria, unspecified: Secondary | ICD-10-CM | POA: Insufficient documentation

## 2018-11-06 DIAGNOSIS — R112 Nausea with vomiting, unspecified: Secondary | ICD-10-CM | POA: Insufficient documentation

## 2018-11-06 DIAGNOSIS — R109 Unspecified abdominal pain: Secondary | ICD-10-CM

## 2018-11-06 DIAGNOSIS — K5900 Constipation, unspecified: Secondary | ICD-10-CM | POA: Insufficient documentation

## 2018-11-06 DIAGNOSIS — Z87891 Personal history of nicotine dependence: Secondary | ICD-10-CM | POA: Insufficient documentation

## 2018-11-06 DIAGNOSIS — Z20828 Contact with and (suspected) exposure to other viral communicable diseases: Secondary | ICD-10-CM | POA: Insufficient documentation

## 2018-11-06 LAB — COMPREHENSIVE METABOLIC PANEL
ALT: 15 U/L (ref 0–44)
AST: 26 U/L (ref 15–41)
Albumin: 4.1 g/dL (ref 3.5–5.0)
Alkaline Phosphatase: 48 U/L (ref 38–126)
Anion gap: 10 (ref 5–15)
BUN: 7 mg/dL (ref 6–20)
CO2: 30 mmol/L (ref 22–32)
Calcium: 9.5 mg/dL (ref 8.9–10.3)
Chloride: 94 mmol/L — ABNORMAL LOW (ref 98–111)
Creatinine, Ser: 1.2 mg/dL (ref 0.61–1.24)
GFR calc Af Amer: >60 mL/min (ref >60)
GFR calc non Af Amer: >60 mL/min (ref >60)
Glucose, Bld: 124 mg/dL — ABNORMAL HIGH (ref 70–99)
Potassium: 3.8 mmol/L (ref 3.5–5.1)
Sodium: 134 mmol/L — ABNORMAL LOW (ref 135–145)
Total Bilirubin: 0.7 mg/dL (ref 0.3–1.2)
Total Protein: 7 g/dL (ref 6.5–8.1)

## 2018-11-06 LAB — CBC
HCT: 44.8 % (ref 39.0–52.0)
Hemoglobin: 15.3 g/dL (ref 13.0–17.0)
MCH: 31.6 pg (ref 26.0–34.0)
MCHC: 34.2 g/dL (ref 30.0–36.0)
MCV: 92.6 fL (ref 80.0–100.0)
Platelets: 239 10*3/uL (ref 150–400)
RBC: 4.84 MIL/uL (ref 4.22–5.81)
RDW: 12.5 % (ref 11.5–15.5)
WBC: 12.2 10*3/uL — ABNORMAL HIGH (ref 4.0–10.5)
nRBC: 0 % (ref 0.0–0.2)

## 2018-11-06 LAB — URINALYSIS, ROUTINE W REFLEX MICROSCOPIC
Bilirubin Urine: NEGATIVE
Glucose, UA: NEGATIVE mg/dL
Ketones, ur: NEGATIVE mg/dL
Leukocytes,Ua: NEGATIVE
Nitrite: NEGATIVE
Protein, ur: NEGATIVE mg/dL
Specific Gravity, Urine: 1.012 (ref 1.005–1.030)
pH: 6 (ref 5.0–8.0)

## 2018-11-06 LAB — LIPASE, BLOOD: Lipase: 37 U/L (ref 11–51)

## 2018-11-06 NOTE — ED Provider Notes (Signed)
[facility] Provider Note   CSN: [id number] Arrival date & time: 11/06/18  2040    History   Chief Complaint Chief Complaint  Patient presents with  . Abdominal Pain    Constipation x 6 days.    HPI Corey Atkinson is a 38 y.o. male.     HPI   Pt is a 38 y/o male with a h/o cannabis hyperemesis syndrome, intussusecption who presents to the ED today for eval of abd pain. Pain located to the LUQ of the abdomen. Pain started about 5 days ago. Pain is waxing and waning. Rates pain 9/10. Pain feels like a stabbing.  Also reports nausea, vomiting (x4), and constipation. He has not had a BM in 7 days. Denies hematemesis. He is still passing gas.   He states he used marijuana yesterday which improved his symptoms.   Past Medical History:  Diagnosis Date  . 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 Abdominal pain, diffuse 03/09/2018  . Nausea & vomiting 03/09/2018  . 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 No pertinent surgical history.      Home Medications    Prior to Admission medications   Medication Sig Start Date End Date Taking? Authorizing Provider  dicyclomine (BENTYL) 20 MG tablet Take 1 tablet (20 mg total) by mouth 2 (two) times daily. Patient not taking: Reported on 11/07/2018 10/23/18   Merrilee JanskyLamptey, Philip O, MD  famotidine (PEPCID) 20 MG tablet Take 1 tablet (20 mg total) by mouth 2 (two) times daily for 7 days. 11/07/18 11/14/18  Demiya Magno S, PA-C  ondansetron (ZOFRAN ODT) 4 MG disintegrating tablet Take 1 tablet (4 mg total) by mouth every 8 (eight) hours as needed for nausea or vomiting. Patient not taking: Reported on 11/07/2018 10/23/18   Merrilee JanskyLamptey, Philip O, MD  pantoprazole (PROTONIX) 20 MG tablet Take 1 tablet (20 mg total) by mouth  daily. Patient not taking: Reported on 11/07/2018 10/23/18   Merrilee JanskyLamptey, Philip O, MD  polyethylene glycol (MIRALAX) 17 g packet Take 17 g by mouth daily. Dissolve one cap full in solution (water, gatorade, etc.) and administer once cap-full daily. You may titrate up daily by 1 cap-full until the patient is having pudding consistency of stools. After the patient is able to start passing softer stools they will need to be on 1/2 cap-full daily for 2 weeks. 11/07/18   Codey Burling S, PA-C  sucralfate (CARAFATE) 1 g tablet Take 1 tablet (1 g total) by mouth 4 (four) times daily -  with meals and at bedtime for 7 days. 11/07/18 11/14/18  Greggory Safranek S, PA-C    Family History No family history on file.  Social History Social History   Tobacco Use  . Smoking status: Former Smoker    Types: Cigarettes    Quit date: 11/12/2013    Years since quitting: 4.9  . Smokeless tobacco: Never Used  Substance Use Topics  . Alcohol use: No  . Drug use: Yes    Types: Marijuana     Allergies   Patient has no known allergies.   Review of Systems Review of Systems  Constitutional: Negative for fever.  HENT: Negative for sore throat.   Eyes: Negative for visual disturbance.  Respiratory: Negative for cough and shortness of breath.   Cardiovascular: Negative for chest  pain.  Gastrointestinal: Positive for abdominal pain, constipation, nausea and vomiting. Negative for diarrhea.  Genitourinary: Negative for dysuria and hematuria.  Musculoskeletal: Negative for back pain.  Skin: Negative for rash.  Neurological: Negative for headaches.  All other systems reviewed and are negative.  Physical Exam Updated Vital Signs BP 120/81   Pulse 65   Temp 98.2 F (36.8 C) (Oral)   Resp 18   Ht 6\' 4"  (1.93 m)   Wt 83.9 kg   SpO2 99%   BMI 22.52 kg/m   Physical Exam Vitals signs and nursing note reviewed.  Constitutional:      Appearance: He is well-developed.  HENT:     Head: Normocephalic and  atraumatic.  Eyes:     Conjunctiva/sclera: Conjunctivae normal.  Neck:     Musculoskeletal: Neck supple.  Cardiovascular:     Rate and Rhythm: Normal rate and regular rhythm.     Heart sounds: Normal heart sounds. No murmur.  Pulmonary:     Effort: Pulmonary effort is normal. No respiratory distress.     Breath sounds: Normal breath sounds. No wheezing, rhonchi or rales.  Abdominal:     General: Abdomen is flat. Bowel sounds are normal.     Palpations: Abdomen is soft.     Tenderness: There is abdominal tenderness in the left upper quadrant and left lower quadrant. There is left CVA tenderness and guarding. There is no right CVA tenderness or rebound.  Skin:    General: Skin is warm and dry.  Neurological:     Mental Status: He is alert.    ED Treatments / Results  Labs (all labs ordered are listed, but only abnormal results are displayed) Labs Reviewed  COMPREHENSIVE METABOLIC PANEL - Abnormal; Notable for the following components:      Result Value   Sodium 134 (*)    Chloride 94 (*)    Glucose, Bld 124 (*)    All other components within normal limits  CBC - Abnormal; Notable for the following components:   WBC 12.2 (*)    All other components within normal limits  URINALYSIS, ROUTINE W REFLEX MICROSCOPIC - Abnormal; Notable for the following components:   APPearance HAZY (*)    Hgb urine dipstick SMALL (*)    Bacteria, UA RARE (*)    All other components within normal limits  NOVEL CORONAVIRUS, NAA (HOSPITAL ORDER, SEND-OUT TO REF LAB)  LIPASE, BLOOD    EKG None  Radiology Ct Abdomen Pelvis W Contrast  Result Date: 11/07/2018 CLINICAL DATA:  38 y/o M; left upper quadrant abdominal pain. History of intussusception. EXAM: CT ABDOMEN AND PELVIS WITH CONTRAST TECHNIQUE: Multidetector CT imaging of the abdomen and pelvis was performed using the standard protocol following bolus administration of intravenous contrast. CONTRAST:  100mL OMNIPAQUE IOHEXOL 300 MG/ML  SOLN  COMPARISON:  05/19/2018 CT abdomen and pelvis. FINDINGS: Lower chest: No acute abnormality. Hepatobiliary: Multiple stable hypoattenuating foci measuring up to 2 cm at dome of liver with interrupted peripheral nodular enhancement on prior CT, compatible with hemangioma. No gallstones, gallbladder wall thickening, or biliary dilatation. Pancreas: Unremarkable. No pancreatic ductal dilatation or surrounding inflammatory changes. Spleen: Normal in size without focal abnormality. Adrenals/Urinary Tract: Adrenal glands are unremarkable. Kidneys are normal, without renal calculi, focal lesion, or hydronephrosis. Bladder is unremarkable. Stomach/Bowel: Stomach is within normal limits. Appendix appears normal. No evidence of bowel wall thickening, distention, or inflammatory changes. Vascular/Lymphatic: No significant vascular findings are present. No enlarged abdominal or pelvic lymph nodes. Reproductive: Prostate  is unremarkable. Other: No abdominal wall hernia or abnormality. No abdominopelvic ascites. Musculoskeletal: No acute or significant osseous findings. IMPRESSION: 1. No acute process identified. 2. Stable hepatic hemangiomas. Electronically Signed   By: Mitzi HansenLance  Furusawa-Stratton M.D.   On: 11/07/2018 01:11    Procedures Procedures (including critical care time)  Medications Ordered in ED Medications  alum & mag hydroxide-simeth (MAALOX/MYLANTA) 200-200-20 MG/5ML suspension 30 mL (30 mLs Oral Not Given 11/07/18 0207)  iohexol (OMNIPAQUE) 300 MG/ML solution 100 mL (100 mLs Intravenous Contrast Given 11/07/18 0042)  ketorolac (TORADOL) 30 MG/ML injection 30 mg (30 mg Intravenous Given 11/07/18 0201)  ondansetron (ZOFRAN) injection 4 mg (4 mg Intravenous Given 11/07/18 0159)  famotidine (PEPCID) IVPB 20 mg premix (0 mg Intravenous Stopped 11/07/18 0235)  sucralfate (CARAFATE) tablet 1 g (1 g Oral Given 11/07/18 0203)  morphine 4 MG/ML injection 4 mg (4 mg Intravenous Given 11/07/18 0254)  bisacodyl (DULCOLAX)  EC tablet 10 mg (10 mg Oral Given 11/07/18 0322)     Initial Impression / Assessment and Plan / ED Course  I have reviewed the triage vital signs and the nursing notes.  Pertinent labs & imaging results that were available during my care of the patient were reviewed by me and considered in my medical decision making (see chart for details).   Final Clinical Impressions(s) / ED Diagnoses   Final diagnoses:  Abdominal pain, unspecified abdominal location  Constipation, unspecified constipation type  Hematuria, unspecified type   38 y/o male presenting with c/o luq abd pain starting 1 week ago. Associated with constipation.  VSS. On exam, has LUQ and LLQ ttp. +guarding. No rebound. +BS.   CBC with mild leukocytosis at 12. CMP with mild hyponatremia and hypochloremia.  Normal kidney and liver function. Lipase is negative UA with hematuria, no pyuria.  No urinary symptoms to suggest UTI.  Given concern for patient's history of intussusception, CT scan of the abdomen was obtained which showed no acute process identified. stable hepatic hemangiomas  Reassessed pt. He is c/o pain. Will order meds. Discussed results and possible diagnosis of constipation, gastritis, gerd, pud, or other. He became very agitated stating that this is what he hears every time he comes to the ED and he wants to know what's wrong with him. He is requesting EGD to r/o PUD. Discussed that we are unable to complete EGD in the ED but that I will refer him GI. He then asks why he was not tested for the coronavirus. I explained that his sxs are not necessarily suggestive of COVID. He is adamant that he wants to be tested for this.   Reassessed pt. He is still c/o pain. Requesting morphine stating that it is the only medication that works for his abd pain.   Reassessed pt. He is sleeping comfortably. Easily arouses to voice. States his pain is resolved. Repeat abd exam is benign. Reiterated plan to dc with meds to tx  constipation and also for possible gastritis. Will give f/u with gi.  Advised on return precautions. Pt in agreement with plan. All questions answered. Pt stable for d/c.   ED Discharge Orders         Ordered    polyethylene glycol (MIRALAX) 17 g packet  Daily     11/07/18 0158    famotidine (PEPCID) 20 MG tablet  2 times daily     11/07/18 0158    sucralfate (CARAFATE) 1 g tablet  3 times daily with meals & bedtime  11/07/18 0158           Karsten Howry, [place], PA-C 58/25/18 9842    Delora Fuel, MD 02/22/27 2259

## 2018-11-06 NOTE — ED Notes (Signed)
The pt is c/o abd pain with no bm for 7 days.  Hx of the same  He has been herfe and at [facility] for the same numerus times

## 2018-11-06 NOTE — ED Triage Notes (Signed)
Patient reports worsening left abdominal pain with emesis for several days , constipated /last BM 6 days ago , no diarrhea or fever .

## 2018-11-07 ENCOUNTER — Emergency Department (HOSPITAL_COMMUNITY): Payer: Self-pay

## 2018-11-07 MED ORDER — SUCRALFATE 1 G PO TABS: 1.0000 g | ORAL_TABLET | Freq: Three times a day (TID) | ORAL | 0 refills | Status: DC

## 2018-11-07 MED ORDER — IOHEXOL 300 MG/ML  SOLN
100.0000 mL | Freq: Once | INTRAMUSCULAR | Status: AC | PRN
  Administered 2018-11-07: 100 mL via INTRAVENOUS

## 2018-11-07 MED ORDER — SUCRALFATE 1 G PO TABS
1.0000 g | ORAL_TABLET | Freq: Once | ORAL | Status: AC
  Administered 2018-11-07: 1 g via ORAL
  Filled 2018-11-07: qty 1

## 2018-11-07 MED ORDER — POLYETHYLENE GLYCOL 3350 17 G PO PACK: 17.0000 g | PACK | Freq: Every day | ORAL | 0 refills | Status: DC

## 2018-11-07 MED ORDER — FAMOTIDINE 20 MG PO TABS: 20.0000 mg | ORAL_TABLET | Freq: Two times a day (BID) | ORAL | 0 refills | Status: DC

## 2018-11-07 MED ORDER — MORPHINE SULFATE (PF) 4 MG/ML IV SOLN
4.0000 mg | Freq: Once | INTRAVENOUS | Status: AC
  Administered 2018-11-07: 4 mg via INTRAVENOUS
  Filled 2018-11-07: qty 1

## 2018-11-07 MED ORDER — ALUM & MAG HYDROXIDE-SIMETH 200-200-20 MG/5ML PO SUSP
30.0000 mL | Freq: Once | ORAL | Status: DC
  Filled 2018-11-07: qty 30

## 2018-11-07 MED ORDER — FAMOTIDINE IN NACL 20-0.9 MG/50ML-% IV SOLN
20.0000 mg | Freq: Once | INTRAVENOUS | Status: AC
  Administered 2018-11-07: 20 mg via INTRAVENOUS
  Filled 2018-11-07: qty 50

## 2018-11-07 MED ORDER — ONDANSETRON HCL 4 MG/2ML IJ SOLN
4.0000 mg | Freq: Once | INTRAMUSCULAR | Status: AC
  Administered 2018-11-07: 4 mg via INTRAVENOUS
  Filled 2018-11-07: qty 2

## 2018-11-07 MED ORDER — KETOROLAC TROMETHAMINE 30 MG/ML IJ SOLN
30.0000 mg | Freq: Once | INTRAMUSCULAR | Status: AC
  Administered 2018-11-07: 30 mg via INTRAVENOUS
  Filled 2018-11-07: qty 1

## 2018-11-07 MED ORDER — BISACODYL 5 MG PO TBEC
10.0000 mg | DELAYED_RELEASE_TABLET | Freq: Once | ORAL | Status: AC
  Administered 2018-11-07: 10 mg via ORAL
  Filled 2018-11-07: qty 2

## 2018-11-07 NOTE — ED Notes (Signed)
Pt making his own calls

## 2018-11-07 NOTE — Discharge Instructions (Addendum)
Please take pepcid and carafate for your abdominal pain as directed. Please take miralax as directed to help with your constipation.   Please follow up with your primary care provider within 5-7 days for re-evaluation of your symptoms and to address the blood in your urine. If you do not have a primary care provider, information for a healthcare clinic has been provided for you to make arrangements for follow up care. You were given a referral to a gastroenterology doctor to further evaluated you abdominal pain. Please call the office to set up and appointment for follow up.   Please return to the emergency department for any new or worsening symptoms.

## 2018-11-08 LAB — NOVEL CORONAVIRUS, NAA (HOSP ORDER, SEND-OUT TO REF LAB; TAT 18-24 HRS): SARS-CoV-2, NAA: NOT DETECTED

## 2018-11-13 ENCOUNTER — Telehealth (HOSPITAL_COMMUNITY): Payer: Self-pay

## 2018-11-29 ENCOUNTER — Emergency Department (HOSPITAL_COMMUNITY): Payer: Self-pay | Admitting: Anesthesiology

## 2018-11-29 ENCOUNTER — Encounter (HOSPITAL_COMMUNITY): Admission: EM | Disposition: A | Discharge status: Home / Self Care | Payer: Self-pay | Source: Home / Self Care

## 2018-11-29 ENCOUNTER — Other Ambulatory Visit: Payer: Self-pay

## 2018-11-29 ENCOUNTER — Inpatient Hospital Stay (HOSPITAL_COMMUNITY)
Admission: EM | Admit: 2018-11-29 | Discharge: 2018-12-05 | DRG: 326 | Disposition: A | Discharge status: Home / Self Care | Payer: Self-pay | Attending: Surgery | Admitting: Surgery

## 2018-11-29 ENCOUNTER — Encounter (HOSPITAL_COMMUNITY): Payer: Self-pay

## 2018-11-29 ENCOUNTER — Emergency Department (HOSPITAL_COMMUNITY): Payer: Self-pay

## 2018-11-29 ENCOUNTER — Inpatient Hospital Stay (HOSPITAL_COMMUNITY): Payer: Self-pay

## 2018-11-29 DIAGNOSIS — F1410 Cocaine abuse, uncomplicated: Secondary | ICD-10-CM | POA: Diagnosis present

## 2018-11-29 DIAGNOSIS — R1010 Upper abdominal pain, unspecified: Secondary | ICD-10-CM

## 2018-11-29 DIAGNOSIS — K265 Chronic or unspecified duodenal ulcer with perforation: Principal | ICD-10-CM

## 2018-11-29 DIAGNOSIS — R1116 Cannabis hyperemesis syndrome: Secondary | ICD-10-CM

## 2018-11-29 DIAGNOSIS — E860 Dehydration: Secondary | ICD-10-CM | POA: Diagnosis present

## 2018-11-29 DIAGNOSIS — K259 Gastric ulcer, unspecified as acute or chronic, without hemorrhage or perforation: Secondary | ICD-10-CM | POA: Diagnosis present

## 2018-11-29 DIAGNOSIS — F12288 Cannabis dependence with other cannabis-induced disorder: Secondary | ICD-10-CM | POA: Diagnosis present

## 2018-11-29 DIAGNOSIS — E871 Hypo-osmolality and hyponatremia: Secondary | ICD-10-CM | POA: Diagnosis present

## 2018-11-29 DIAGNOSIS — Z79899 Other long term (current) drug therapy: Secondary | ICD-10-CM

## 2018-11-29 DIAGNOSIS — Z978 Presence of other specified devices: Secondary | ICD-10-CM

## 2018-11-29 DIAGNOSIS — K668 Other specified disorders of peritoneum: Secondary | ICD-10-CM

## 2018-11-29 DIAGNOSIS — J982 Interstitial emphysema: Secondary | ICD-10-CM | POA: Diagnosis present

## 2018-11-29 DIAGNOSIS — R188 Other ascites: Secondary | ICD-10-CM | POA: Diagnosis present

## 2018-11-29 DIAGNOSIS — F1290 Cannabis use, unspecified, uncomplicated: Secondary | ICD-10-CM

## 2018-11-29 DIAGNOSIS — F1510 Other stimulant abuse, uncomplicated: Secondary | ICD-10-CM | POA: Diagnosis present

## 2018-11-29 DIAGNOSIS — K567 Ileus, unspecified: Secondary | ICD-10-CM | POA: Diagnosis present

## 2018-11-29 DIAGNOSIS — Z20828 Contact with and (suspected) exposure to other viral communicable diseases: Secondary | ICD-10-CM | POA: Diagnosis present

## 2018-11-29 DIAGNOSIS — N179 Acute kidney failure, unspecified: Secondary | ICD-10-CM | POA: Diagnosis present

## 2018-11-29 DIAGNOSIS — I10 Essential (primary) hypertension: Secondary | ICD-10-CM | POA: Diagnosis present

## 2018-11-29 DIAGNOSIS — K650 Generalized (acute) peritonitis: Secondary | ICD-10-CM | POA: Diagnosis present

## 2018-11-29 DIAGNOSIS — F1220 Cannabis dependence, uncomplicated: Secondary | ICD-10-CM | POA: Diagnosis present

## 2018-11-29 DIAGNOSIS — R112 Nausea with vomiting, unspecified: Secondary | ICD-10-CM

## 2018-11-29 DIAGNOSIS — R198 Other specified symptoms and signs involving the digestive system and abdomen: Secondary | ICD-10-CM

## 2018-11-29 DIAGNOSIS — Z87891 Personal history of nicotine dependence: Secondary | ICD-10-CM

## 2018-11-29 HISTORY — PX: LAPAROTOMY: SHX154

## 2018-11-29 LAB — RAPID URINE DRUG SCREEN, HOSP PERFORMED
Amphetamines: POSITIVE — AB
Barbiturates: NOT DETECTED
Benzodiazepines: NOT DETECTED
Cocaine: POSITIVE — AB
Opiates: NOT DETECTED
Tetrahydrocannabinol: POSITIVE — AB

## 2018-11-29 LAB — CBC WITH DIFFERENTIAL/PLATELET
Abs Immature Granulocytes: 0.09 10*3/uL — ABNORMAL HIGH (ref 0.00–0.07)
Abs Immature Granulocytes: 0.1 10*3/uL — ABNORMAL HIGH (ref 0.00–0.07)
Basophils Absolute: 0.1 10*3/uL (ref 0.0–0.1)
Basophils Absolute: 0 10*3/uL (ref 0.0–0.1)
Basophils Relative: 1 %
Basophils Relative: 0 %
Eosinophils Absolute: 0.1 10*3/uL (ref 0.0–0.5)
Eosinophils Absolute: 0 10*3/uL (ref 0.0–0.5)
Eosinophils Relative: 0 %
Eosinophils Relative: 0 %
HCT: 51.8 % (ref 39.0–52.0)
HCT: 42.3 % (ref 39.0–52.0)
Hemoglobin: 18.1 g/dL — ABNORMAL HIGH (ref 13.0–17.0)
Hemoglobin: 14.4 g/dL (ref 13.0–17.0)
Immature Granulocytes: 1 %
Immature Granulocytes: 1 %
Lymphocytes Relative: 7 %
Lymphocytes Relative: 5 %
Lymphs Abs: 1.1 10*3/uL (ref 0.7–4.0)
Lymphs Abs: 0.6 10*3/uL — ABNORMAL LOW (ref 0.7–4.0)
MCH: 32 pg (ref 26.0–34.0)
MCH: 31.5 pg (ref 26.0–34.0)
MCHC: 34.9 g/dL (ref 30.0–36.0)
MCHC: 34 g/dL (ref 30.0–36.0)
MCV: 91.7 fL (ref 80.0–100.0)
MCV: 92.6 fL (ref 80.0–100.0)
Monocytes Absolute: 0.3 10*3/uL (ref 0.1–1.0)
Monocytes Absolute: 0.4 10*3/uL (ref 0.1–1.0)
Monocytes Relative: 2 %
Monocytes Relative: 3 %
Neutro Abs: 13.2 10*3/uL — ABNORMAL HIGH (ref 1.7–7.7)
Neutro Abs: 10 10*3/uL — ABNORMAL HIGH (ref 1.7–7.7)
Neutrophils Relative %: 89 %
Neutrophils Relative %: 91 %
Platelets: 190 10*3/uL (ref 150–400)
Platelets: 121 10*3/uL — ABNORMAL LOW (ref 150–400)
RBC: 5.65 MIL/uL (ref 4.22–5.81)
RBC: 4.57 MIL/uL (ref 4.22–5.81)
RDW: 13.1 % (ref 11.5–15.5)
RDW: 13.1 % (ref 11.5–15.5)
WBC: 14.8 10*3/uL — ABNORMAL HIGH (ref 4.0–10.5)
WBC: 11.1 10*3/uL — ABNORMAL HIGH (ref 4.0–10.5)
nRBC: 0 % (ref 0.0–0.2)
nRBC: 0 % (ref 0.0–0.2)

## 2018-11-29 LAB — COMPREHENSIVE METABOLIC PANEL
ALT: 33 U/L (ref 0–44)
AST: 82 U/L — ABNORMAL HIGH (ref 15–41)
Albumin: 3.6 g/dL (ref 3.5–5.0)
Alkaline Phosphatase: 36 U/L — ABNORMAL LOW (ref 38–126)
Anion gap: 17 — ABNORMAL HIGH (ref 5–15)
BUN: 40 mg/dL — ABNORMAL HIGH (ref 6–20)
CO2: 19 mmol/L — ABNORMAL LOW (ref 22–32)
Calcium: 8.4 mg/dL — ABNORMAL LOW (ref 8.9–10.3)
Chloride: 93 mmol/L — ABNORMAL LOW (ref 98–111)
Creatinine, Ser: 2.17 mg/dL — ABNORMAL HIGH (ref 0.61–1.24)
GFR calc Af Amer: 44 mL/min — ABNORMAL LOW (ref >60)
GFR calc non Af Amer: 38 mL/min — ABNORMAL LOW (ref >60)
Glucose, Bld: 113 mg/dL — ABNORMAL HIGH (ref 70–99)
Potassium: 4.1 mmol/L (ref 3.5–5.1)
Sodium: 129 mmol/L — ABNORMAL LOW (ref 135–145)
Total Bilirubin: 0.8 mg/dL (ref 0.3–1.2)
Total Protein: 6.3 g/dL — ABNORMAL LOW (ref 6.5–8.1)

## 2018-11-29 LAB — BASIC METABOLIC PANEL
Anion gap: 13 (ref 5–15)
BUN: 41 mg/dL — ABNORMAL HIGH (ref 6–20)
CO2: 20 mmol/L — ABNORMAL LOW (ref 22–32)
Calcium: 7.7 mg/dL — ABNORMAL LOW (ref 8.9–10.3)
Chloride: 93 mmol/L — ABNORMAL LOW (ref 98–111)
Creatinine, Ser: 2.3 mg/dL — ABNORMAL HIGH (ref 0.61–1.24)
GFR calc Af Amer: 41 mL/min — ABNORMAL LOW (ref >60)
GFR calc non Af Amer: 35 mL/min — ABNORMAL LOW (ref >60)
Glucose, Bld: 120 mg/dL — ABNORMAL HIGH (ref 70–99)
Potassium: 5.1 mmol/L (ref 3.5–5.1)
Sodium: 126 mmol/L — ABNORMAL LOW (ref 135–145)

## 2018-11-29 LAB — TYPE AND SCREEN
ABO/RH(D): A POS
Antibody Screen: NEGATIVE

## 2018-11-29 LAB — URINALYSIS, ROUTINE W REFLEX MICROSCOPIC
Bilirubin Urine: NEGATIVE
Glucose, UA: 50 mg/dL — AB
Ketones, ur: NEGATIVE mg/dL
Leukocytes,Ua: NEGATIVE
Nitrite: NEGATIVE
Protein, ur: 30 mg/dL — AB
Specific Gravity, Urine: 1.024 (ref 1.005–1.030)
pH: 5 (ref 5.0–8.0)

## 2018-11-29 LAB — ABO/RH: ABO/RH(D): A POS

## 2018-11-29 LAB — LIPASE, BLOOD: Lipase: 68 U/L — ABNORMAL HIGH (ref 11–51)

## 2018-11-29 LAB — SARS CORONAVIRUS 2 BY RT PCR (HOSPITAL ORDER, PERFORMED IN ~~LOC~~ HOSPITAL LAB): SARS Coronavirus 2: NEGATIVE

## 2018-11-29 LAB — ETHANOL: Alcohol, Ethyl (B): <10 mg/dL (ref <10)

## 2018-11-29 SURGERY — LAPAROTOMY, EXPLORATORY
Anesthesia: General | Site: Abdomen

## 2018-11-29 MED ORDER — HYDROMORPHONE HCL 2 MG/ML IJ SOLN
INTRAMUSCULAR | Status: AC
  Filled 2018-11-29: qty 1

## 2018-11-29 MED ORDER — MIDAZOLAM HCL 5 MG/5ML IJ SOLN
INTRAMUSCULAR | Status: DC | PRN
  Administered 2018-11-29 (×2): 1 mg via INTRAVENOUS

## 2018-11-29 MED ORDER — CHLORHEXIDINE GLUCONATE CLOTH 2 % EX PADS: 6.0000 | MEDICATED_PAD | Freq: Once | CUTANEOUS | Status: DC

## 2018-11-29 MED ORDER — HYDROMORPHONE HCL 1 MG/ML IJ SOLN: 0.2500 mg | INTRAMUSCULAR | Status: DC | PRN

## 2018-11-29 MED ORDER — ONDANSETRON HCL 4 MG/2ML IJ SOLN
INTRAMUSCULAR | Status: DC | PRN
  Administered 2018-11-29: 4 mg via INTRAVENOUS

## 2018-11-29 MED ORDER — PIPERACILLIN-TAZOBACTAM 3.375 G IVPB
3.3750 g | Freq: Three times a day (TID) | INTRAVENOUS | Status: DC
  Administered 2018-11-29 – 2018-12-05 (×17): 3.375 g via INTRAVENOUS
  Filled 2018-11-29 (×17): qty 50

## 2018-11-29 MED ORDER — PIPERACILLIN-TAZOBACTAM 3.375 G IVPB: 3.3750 g | Freq: Three times a day (TID) | INTRAVENOUS | Status: DC

## 2018-11-29 MED ORDER — PANTOPRAZOLE SODIUM 40 MG IV SOLR
40.0000 mg | Freq: Once | INTRAVENOUS | Status: AC
  Administered 2018-11-29: 16:00:00 40 mg via INTRAVENOUS
  Filled 2018-11-29: qty 40

## 2018-11-29 MED ORDER — SUCCINYLCHOLINE CHLORIDE 20 MG/ML IJ SOLN
INTRAMUSCULAR | Status: DC | PRN
  Administered 2018-11-29: 80 mg via INTRAVENOUS

## 2018-11-29 MED ORDER — DEXAMETHASONE SODIUM PHOSPHATE 10 MG/ML IJ SOLN
INTRAMUSCULAR | Status: DC | PRN
  Administered 2018-11-29: 10 mg via INTRAVENOUS

## 2018-11-29 MED ORDER — ACETAMINOPHEN 10 MG/ML IV SOLN: 1000.0000 mg | Freq: Once | INTRAVENOUS | Status: DC | PRN

## 2018-11-29 MED ORDER — LACTATED RINGERS IR SOLN
Status: DC | PRN
  Administered 2018-11-29: 7000 mL

## 2018-11-29 MED ORDER — HYDROMORPHONE HCL 1 MG/ML IJ SOLN
INTRAMUSCULAR | Status: DC | PRN
  Administered 2018-11-29: 0.5 mg via INTRAVENOUS

## 2018-11-29 MED ORDER — FENTANYL CITRATE (PF) 100 MCG/2ML IJ SOLN
INTRAMUSCULAR | Status: AC
  Filled 2018-11-29: qty 2

## 2018-11-29 MED ORDER — FENTANYL CITRATE (PF) 100 MCG/2ML IJ SOLN
INTRAMUSCULAR | Status: DC | PRN
  Administered 2018-11-29 (×2): 100 ug via INTRAVENOUS
  Administered 2018-11-29: 50 ug via INTRAVENOUS
  Administered 2018-11-29: 100 ug via INTRAVENOUS

## 2018-11-29 MED ORDER — HYDROCODONE-ACETAMINOPHEN 7.5-325 MG PO TABS: 1.0000 | ORAL_TABLET | Freq: Once | ORAL | Status: DC | PRN

## 2018-11-29 MED ORDER — SODIUM CHLORIDE 0.9 % IV BOLUS
1000.0000 mL | Freq: Once | INTRAVENOUS | Status: AC
  Administered 2018-11-29: 1000 mL via INTRAVENOUS

## 2018-11-29 MED ORDER — PANTOPRAZOLE SODIUM 40 MG IV SOLR
40.0000 mg | Freq: Two times a day (BID) | INTRAVENOUS | Status: DC
  Filled 2018-11-29: qty 40

## 2018-11-29 MED ORDER — PIPERACILLIN-TAZOBACTAM 3.375 G IVPB 30 MIN
3.3750 g | Freq: Once | INTRAVENOUS | Status: AC
  Administered 2018-11-29: 3.375 g via INTRAVENOUS
  Filled 2018-11-29: qty 50

## 2018-11-29 MED ORDER — BUPIVACAINE HCL 0.25 % IJ SOLN
INTRAMUSCULAR | Status: DC | PRN
  Administered 2018-11-29: 30 mL

## 2018-11-29 MED ORDER — TRAMADOL HCL 50 MG PO TABS
50.0000 mg | ORAL_TABLET | Freq: Four times a day (QID) | ORAL | Status: DC | PRN
  Administered 2018-12-04 – 2018-12-05 (×2): 50 mg via ORAL
  Filled 2018-11-29 (×2): qty 1

## 2018-11-29 MED ORDER — PROPOFOL 10 MG/ML IV BOLUS
INTRAVENOUS | Status: DC | PRN
  Administered 2018-11-29: 150 mg via INTRAVENOUS

## 2018-11-29 MED ORDER — ROCURONIUM BROMIDE 10 MG/ML (PF) SYRINGE
PREFILLED_SYRINGE | INTRAVENOUS | Status: DC | PRN
  Administered 2018-11-29: 10 mg via INTRAVENOUS
  Administered 2018-11-29: 40 mg via INTRAVENOUS

## 2018-11-29 MED ORDER — HALOPERIDOL LACTATE 5 MG/ML IJ SOLN
5.0000 mg | Freq: Once | INTRAMUSCULAR | Status: AC
  Administered 2018-11-29: 14:00:00 5 mg via INTRAVENOUS
  Filled 2018-11-29: qty 1

## 2018-11-29 MED ORDER — MIDAZOLAM HCL 2 MG/2ML IJ SOLN
INTRAMUSCULAR | Status: AC
  Filled 2018-11-29: qty 2

## 2018-11-29 MED ORDER — MEPERIDINE HCL 50 MG/ML IJ SOLN: 6.2500 mg | INTRAMUSCULAR | Status: DC | PRN

## 2018-11-29 MED ORDER — MORPHINE SULFATE (PF) 4 MG/ML IV SOLN
4.0000 mg | Freq: Once | INTRAVENOUS | Status: AC
  Administered 2018-11-29: 4 mg via INTRAVENOUS
  Filled 2018-11-29: qty 1

## 2018-11-29 MED ORDER — KCL IN DEXTROSE-NACL 20-5-0.45 MEQ/L-%-% IV SOLN
INTRAVENOUS | Status: DC
  Administered 2018-11-29 – 2018-12-04 (×13): via INTRAVENOUS
  Filled 2018-11-29 (×16): qty 1000

## 2018-11-29 MED ORDER — ONDANSETRON HCL 4 MG/2ML IJ SOLN
4.0000 mg | Freq: Four times a day (QID) | INTRAMUSCULAR | Status: DC | PRN
  Administered 2018-11-30: 4 mg via INTRAVENOUS
  Filled 2018-11-29: qty 2

## 2018-11-29 MED ORDER — PROMETHAZINE HCL 25 MG/ML IJ SOLN: 6.2500 mg | INTRAMUSCULAR | Status: DC | PRN

## 2018-11-29 MED ORDER — PANTOPRAZOLE SODIUM 40 MG IV SOLR
40.0000 mg | Freq: Two times a day (BID) | INTRAVENOUS | Status: DC
  Administered 2018-11-29 – 2018-12-05 (×12): 40 mg via INTRAVENOUS
  Filled 2018-11-29 (×13): qty 40

## 2018-11-29 MED ORDER — ONDANSETRON HCL 4 MG/2ML IJ SOLN
INTRAMUSCULAR | Status: AC
  Filled 2018-11-29: qty 2

## 2018-11-29 MED ORDER — LACTATED RINGERS IV SOLN
INTRAVENOUS | Status: DC
  Administered 2018-11-29 (×4): via INTRAVENOUS

## 2018-11-29 MED ORDER — FENTANYL CITRATE (PF) 250 MCG/5ML IJ SOLN
INTRAMUSCULAR | Status: AC
  Filled 2018-11-29: qty 5

## 2018-11-29 MED ORDER — MORPHINE SULFATE (PF) 2 MG/ML IV SOLN
1.0000 mg | INTRAVENOUS | Status: DC | PRN
  Administered 2018-11-30 – 2018-12-01 (×14): 2 mg via INTRAVENOUS
  Administered 2018-12-02: 4 mg via INTRAVENOUS
  Administered 2018-12-02 (×5): 2 mg via INTRAVENOUS
  Administered 2018-12-02 (×2): 4 mg via INTRAVENOUS
  Administered 2018-12-02: 2 mg via INTRAVENOUS
  Administered 2018-12-03 (×4): 4 mg via INTRAVENOUS
  Filled 2018-11-29: qty 2
  Filled 2018-11-29 (×2): qty 1
  Filled 2018-11-29: qty 2
  Filled 2018-11-29 (×7): qty 1
  Filled 2018-11-29: qty 2
  Filled 2018-11-29: qty 1
  Filled 2018-11-29: qty 2
  Filled 2018-11-29 (×2): qty 1
  Filled 2018-11-29: qty 2
  Filled 2018-11-29 (×3): qty 1
  Filled 2018-11-29 (×3): qty 2
  Filled 2018-11-29 (×2): qty 1
  Filled 2018-11-29: qty 2
  Filled 2018-11-29 (×2): qty 1

## 2018-11-29 MED ORDER — LIDOCAINE 2% (20 MG/ML) 5 ML SYRINGE
INTRAMUSCULAR | Status: DC | PRN
  Administered 2018-11-29: 100 mg via INTRAVENOUS

## 2018-11-29 MED ORDER — ONDANSETRON 4 MG PO TBDP: 4.0000 mg | ORAL_TABLET | Freq: Four times a day (QID) | ORAL | Status: DC | PRN

## 2018-11-29 MED ORDER — HYDROCODONE-ACETAMINOPHEN 5-325 MG PO TABS
1.0000 | ORAL_TABLET | ORAL | Status: DC | PRN
  Filled 2018-11-29: qty 2

## 2018-11-29 MED ORDER — PROPOFOL 10 MG/ML IV BOLUS
INTRAVENOUS | Status: AC
  Filled 2018-11-29: qty 20

## 2018-11-29 MED ORDER — ONDANSETRON HCL 4 MG/2ML IJ SOLN
4.0000 mg | Freq: Once | INTRAMUSCULAR | Status: AC
  Administered 2018-11-29: 4 mg via INTRAVENOUS
  Filled 2018-11-29: qty 2

## 2018-11-29 MED ORDER — ENOXAPARIN SODIUM 30 MG/0.3ML ~~LOC~~ SOLN
30.0000 mg | Freq: Every day | SUBCUTANEOUS | Status: DC
  Administered 2018-11-30: 30 mg via SUBCUTANEOUS
  Filled 2018-11-29 (×2): qty 0.3

## 2018-11-29 MED ORDER — SUCCINYLCHOLINE CHLORIDE 200 MG/10ML IV SOSY
PREFILLED_SYRINGE | INTRAVENOUS | Status: AC
  Filled 2018-11-29: qty 10

## 2018-11-29 MED ORDER — SODIUM CHLORIDE 0.9 % IR SOLN
Status: DC | PRN
  Administered 2018-11-29: 1000 mL

## 2018-11-29 SURGICAL SUPPLY — 49 items
APPLICATOR COTTON TIP 6 STRL (MISCELLANEOUS) ×1 IMPLANT
APPLICATOR COTTON TIP 6IN STRL (MISCELLANEOUS) ×2
BLADE EXTENDED COATED 6.5IN (ELECTRODE) IMPLANT
BLADE HEX COATED 2.75 (ELECTRODE) ×2 IMPLANT
CHLORAPREP W/TINT 26 (MISCELLANEOUS) ×2 IMPLANT
COVER MAYO STAND STRL (DRAPES) ×1 IMPLANT
COVER SURGICAL LIGHT HANDLE (MISCELLANEOUS) ×2 IMPLANT
COVER WAND RF STERILE (DRAPES) ×1 IMPLANT
DERMABOND ADVANCED (GAUZE/BANDAGES/DRESSINGS) ×1
DERMABOND ADVANCED .7 DNX12 (GAUZE/BANDAGES/DRESSINGS) IMPLANT
DRAPE LAPAROSCOPIC ABDOMINAL (DRAPES) ×2 IMPLANT
DRAPE WARM FLUID 44X44 (DRAPES) ×1 IMPLANT
ELECT REM PT RETURN 15FT ADLT (MISCELLANEOUS) ×2 IMPLANT
GAUZE SPONGE 4X4 12PLY STRL (GAUZE/BANDAGES/DRESSINGS) ×1 IMPLANT
GLOVE BIOGEL PI IND STRL 7.0 (GLOVE) ×1 IMPLANT
GLOVE BIOGEL PI IND STRL 7.5 (GLOVE) IMPLANT
GLOVE BIOGEL PI IND STRL 8 (GLOVE) IMPLANT
GLOVE BIOGEL PI INDICATOR 7.0 (GLOVE) ×1
GLOVE BIOGEL PI INDICATOR 7.5 (GLOVE) ×1
GLOVE BIOGEL PI INDICATOR 8 (GLOVE) ×2
GLOVE ECLIPSE 8.0 STRL XLNG CF (GLOVE) ×2 IMPLANT
GLOVE SURG ORTHO 8.0 STRL STRW (GLOVE) ×2 IMPLANT
GLOVE SURG SS PI 7.5 STRL IVOR (GLOVE) ×1 IMPLANT
GOWN STRL REUS W/TWL LRG LVL3 (GOWN DISPOSABLE) ×2 IMPLANT
GOWN STRL REUS W/TWL XL LVL3 (GOWN DISPOSABLE) ×5 IMPLANT
HANDLE SUCTION POOLE (INSTRUMENTS) IMPLANT
KIT BASIN OR (CUSTOM PROCEDURE TRAY) ×2 IMPLANT
KIT TURNOVER KIT A (KITS) ×1 IMPLANT
PACK GENERAL/GYN (CUSTOM PROCEDURE TRAY) ×2 IMPLANT
SLEEVE XCEL OPT CAN 5 100 (ENDOMECHANICALS) ×2 IMPLANT
SPONGE LAP 18X18 RF (DISPOSABLE) IMPLANT
STAPLER VISISTAT 35W (STAPLE) ×2 IMPLANT
SUCTION POOLE HANDLE (INSTRUMENTS)
SUT MON AB 4-0 SH 27 (SUTURE) ×1 IMPLANT
SUT NOV 1 T60/GS (SUTURE) IMPLANT
SUT SILK 2 0 (SUTURE)
SUT SILK 2 0 SH (SUTURE) ×3 IMPLANT
SUT SILK 2 0 SH CR/8 (SUTURE) IMPLANT
SUT SILK 2-0 18XBRD TIE 12 (SUTURE) IMPLANT
SUT SILK 3 0 (SUTURE)
SUT SILK 3 0 SH CR/8 (SUTURE) ×2 IMPLANT
SUT SILK 3-0 18XBRD TIE 12 (SUTURE) IMPLANT
SUT VICRYL 2 0 18  UND BR (SUTURE)
SUT VICRYL 2 0 18 UND BR (SUTURE) IMPLANT
TOWEL OR 17X26 10 PK STRL BLUE (TOWEL DISPOSABLE) ×4 IMPLANT
TRAY FOLEY MTR SLVR 16FR STAT (SET/KITS/TRAYS/PACK) ×1 IMPLANT
TROCAR XCEL NON-BLD 11X100MML (ENDOMECHANICALS) ×1 IMPLANT
WATER STERILE IRR 1000ML POUR (IV SOLUTION) ×2 IMPLANT
YANKAUER SUCT BULB TIP NO VENT (SUCTIONS) IMPLANT

## 2018-11-29 NOTE — ED Notes (Signed)
Patient transported to radiology

## 2018-11-29 NOTE — Anesthesia Preprocedure Evaluation (Signed)
Anesthesia Evaluation  Patient identified by MRN, date of birth, ID band Patient awake  General Assessment Comment:Pt Somnulent but arousable  Reviewed: Allergy & Precautions, H&P , NPO status , Patient's Chart, lab work & pertinent test results  Airway Mallampati: II  TM Distance: >3 FB Neck ROM: Full    Dental no notable dental hx. (+) Teeth Intact   Pulmonary former smoker,    Pulmonary exam normal breath sounds clear to auscultation       Cardiovascular Exercise Tolerance: Good negative cardio ROS Normal cardiovascular exam Rhythm:Regular Rate:Normal     Neuro/Psych negative neurological ROS  negative psych ROS   GI/Hepatic (+)     substance abuse  cocaine use, marijuana use and methamphetamine use,   Endo/Other    Renal/GU ARFRenal diseaseCr 2.17     Musculoskeletal   Abdominal   Peds  Hematology  (+) Blood dyscrasia, , Hgb 18.1 WBC 14.8   Anesthesia Other Findings   Reproductive/Obstetrics                            Lab Results  Component Value Date   WBC 14.8 (H) 11/29/2018   HGB 18.1 (H) 11/29/2018   HCT 51.8 11/29/2018   MCV 91.7 11/29/2018   PLT 190 11/29/2018   Lab Results  Component Value Date   CREATININE 2.17 (H) 11/29/2018   BUN 40 (H) 11/29/2018   NA 129 (L) 11/29/2018   K 4.1 11/29/2018   CL 93 (L) 11/29/2018   CO2 19 (L) 11/29/2018    Anesthesia Physical Anesthesia Plan  ASA: III and emergent  Anesthesia Plan: General   Post-op Pain Management:    Induction: Rapid sequence, Cricoid pressure planned and Intravenous  PONV Risk Score and Plan: 3 and Treatment may vary due to age or medical condition, Promethazine, Ondansetron, Dexamethasone and Midazolam  Airway Management Planned: Oral ETT and Video Laryngoscope Planned  Additional Equipment:   Intra-op Plan:   Post-operative Plan: Extubation in OR  Informed Consent: I have reviewed the  patients History and Physical, chart, labs and discussed the procedure including the risks, benefits and alternatives for the proposed anesthesia with the patient or authorized representative who has indicated his/her understanding and acceptance.     Dental advisory given  Plan Discussed with: CRNA  Anesthesia Plan Comments: (38 y/o M w perferated viscous and positive tox screen (Amphed, Coca, Marij) for emergent ex lap)       Anesthesia Quick Evaluation

## 2018-11-29 NOTE — ED Triage Notes (Signed)
Patient BIB EMS from [facility]. Patient complaining of lower abdominal pain x3 days. Patient reports decreased bowel movements and urine output x3 days. Patient reports lower abdominal "pressure." Patient also reports "breathing makes my stomach hurt worse." Patient continuously requested oxygen from EMS, but was 99-100% on RA.   EMS VS: 149/106, 98% RA, 98.26F, 20 RR, 106 HR  20g Left Forearm PIV started by EMS.

## 2018-11-29 NOTE — Transfer of Care (Signed)
Immediate Anesthesia Transfer of Care Note  Patient: Corey Atkinson  Procedure(s) Performed: Procedure(s): EXPLORATORY LAPAROTOMY WITH OVERSEW OF PYLORIC CHANNEL ULCER IN OMENTAL PATCH (N/A)  Patient Location: PACU  Anesthesia Type:General  Level of Consciousness:  sedated, patient cooperative and responds to stimulation  Airway & Oxygen Therapy:Patient Spontanous Breathing and Patient connected to face mask oxgen  Post-op Assessment:  Report given to PACU RN and Post -op Vital signs reviewed and stable  Post vital signs:  Reviewed and stable  Last Vitals:  Vitals:   11/29/18 1800 11/29/18 1821  BP: (!) 158/109 (!) 158/102  Pulse: 87 82  Resp: 16 18  Temp:  37.2 C  SpO2: 37% 943%    Complications: No apparent anesthesia complications

## 2018-11-29 NOTE — ED Notes (Signed)
He is drowsy and easily arousable. O.R. Permit signed. His belongings remain with him--he has notified a "friend" to come and get them. He is to notify us of the arrival of his friend.

## 2018-11-29 NOTE — ED Provider Notes (Signed)
Patient has a ruptured viscus on CT scan.  He has been given antibiotics and fluids and surgery has been consulted for admission.  Suspect he has a rupture from an ulcer   Milton Ferguson, MD 11/29/18 [phone]

## 2018-11-29 NOTE — Op Note (Addendum)
11/29/2018  8:45 PM  PATIENT:  Corey Atkinson, 38 y.o., male, MRN: [id number]  PREOP DIAGNOSIS:  PERFORATED VISCOUS  POSTOP DIAGNOSIS:   Perforated pyloric channel ulcer  PROCEDURE:   Procedure(s): Laparoscopic EXPLORATORY LAPAROTOMY WITH OVERSEW OF PYLORIC CHANNEL ULCER IN OMENTAL PATCH  SURGEON:   Alphonsa Overall, M.D.  ASSISTANTClyda Greener, M.D.  ANESTHESIA:   general  Anesthesiologist: Barnet Glasgow, MD CRNA: Anne Fu, CRNA; Gean Maidens, CRNA  General  EBL:  minimal  ml  BLOOD ADMINISTERED: none  DRAINS: none   LOCAL MEDICATIONS USED:   30 cc of 1/4% marcaine  SPECIMEN:   None  COUNTS CORRECT:  YES  INDICATIONS FOR PROCEDURE:  Corey Atkinson is a 38 y.o. (DOB: 1980/10/07) AA male whose primary care physician is Patient, No Pcp Per and comes with a pneumoperitoneum, significant intraabdominal fluid, and probable perforated viscus.  This is an emergency operation.   The indications and risks of the surgery were explained to the patient.  The risks include, but are not limited to, infection, bleeding, and nerve injury.  PROCEDURE: The patient was taken to room #4 at [facility].  He underwent a general anesthetic.  He had an NG tube placed.  He had a Foley catheter placed.  His abdomen was prepped with ChloraPrep and sterilely draped.  A timeout was held the surgical checklist run.  I accessed his abdominal cavity with a 5 mm Optiview Ethicon trocar in the left upper quadrant.  I placed 4 additional trocar sites: A 5 mm torcar in the left paramedian location, a 5 mm trocar in the right paramedian location, a 5 mm trocar in the right upper quadrant (I later upsized this to an 11 mm trocar to pass needles into the abdomen), and a 5 mm subxiphoid puncture wound for the [facility] liver retractor.  On entering the abdomen he has significant bile-stained contamination with purulence coating his bowel.  I sucked out over 2 L of bile-stained  peritoneal fluid.  The Nathanson retractor retractor was then placed under the right and left lobe of liver.  He had an approximate 1 cm ulcer that had perforated in his pyloric channel right at his pylorus.  I closed this perforation laparoscopically with three 2-0 silk sutures.  I brought up the omentum from the transverse colon and tied this on top of my ulcer closure.  He is a thin man, with thin and limited omentum.  I then spent over 30 minutes irrigating at his abdomen with over 5 L of saline trying to clean up all the gross contamination and purulence.  His bowel was distended consistent with an ileus from the contaminated fluid.  I then removed each trocar.  I infiltrated the trocar site with quarter percent Marcaine for a total of 30 cc.  The 11 mm trocar in the right upper quadrant hole was small enough that I did not think he needed a suture.  All the wounds were painted with Dermabond.  He was transferred to recovery in good condition.  The sponge and needle count were correct at the end of the case.  I have a surgeon as a first assist to retract, expose, and assist on this difficult operation.    Left upper picture: gall bladder and liver Right upper picture: omental patch over ucler Left lower picture:  Omental patch over ulcer  Alphonsa Overall, MD, [facility] Surgery Pager: [phone] Office phone:  [phone]

## 2018-11-29 NOTE — ED Provider Notes (Addendum)
[facility] DEPT Provider Note   CSN: [id number] Arrival date & time: 11/29/18  1301     History   Chief Complaint Chief Complaint  Patient presents with  . Abdominal Pain    HPI Corey Atkinson is a 38 y.o. male.     The history is provided by the patient and medical records. No language interpreter was used.  Abdominal Pain    38 year old male with history of marijuana abuse, presenting to ED for evaluation of abdominal pain.  Patient brought here via EMS.  Patient report for the past 2 to 3 days he has had persistent diffuse abdominal pain.  He described as a sharp sensation, persistent, worsening with movement but present at rest.  He endorsed nausea, vomiting nonbloody nonbilious contents.  He also report for the past 2 days he is having difficulty urinating.  States he has urge.  Having trouble peeing.  He also states abdominal pain radiates to his back.  He mentioned pain is very severe, specifically requesting morphine as treatment.  He denies any fever chills no chest pain shortness of breath or cough.  No history of IV drug use or active cancer.  No numbness or weakness down his legs.  No bowel bladder incontinence or saddle anesthesia.  He did does admit to marijuana use last use was yesterday.  He denies heavy alcohol use.  No history of kidney stone.  Past Medical History:  Diagnosis Date  . Cannabis hyperemesis syndrome concurrent with and due to cannabis dependence ([facility]) 03/09/2018    Patient Active Problem List   Diagnosis Date Noted  . Abdominal pain, diffuse 03/09/2018  . Nausea & vomiting 03/09/2018  . Cannabis hyperemesis syndrome concurrent with and due to cannabis dependence ([place]) 03/09/2018    No past surgical history on file.      Home Medications    Prior to Admission medications   Medication Sig Start Date End Date Taking? Authorizing Provider  dicyclomine (BENTYL) 20 MG tablet Take 1 tablet (20 mg total) by  mouth 2 (two) times daily. Patient not taking: Reported on 11/07/2018 10/23/18   Chase Picket, MD  famotidine (PEPCID) 20 MG tablet Take 1 tablet (20 mg total) by mouth 2 (two) times daily for 7 days. 11/07/18 11/14/18  Couture, Cortni S, PA-C  ondansetron (ZOFRAN ODT) 4 MG disintegrating tablet Take 1 tablet (4 mg total) by mouth every 8 (eight) hours as needed for nausea or vomiting. Patient not taking: Reported on 11/07/2018 10/23/18   Chase Picket, MD  pantoprazole (PROTONIX) 20 MG tablet Take 1 tablet (20 mg total) by mouth daily. Patient not taking: Reported on 11/07/2018 10/23/18   Chase Picket, MD  polyethylene glycol (MIRALAX) 17 g packet Take 17 g by mouth daily. Dissolve one cap full in solution (water, gatorade, etc.) and administer once cap-full daily. You may titrate up daily by 1 cap-full until the patient is having pudding consistency of stools. After the patient is able to start passing softer stools they will need to be on 1/2 cap-full daily for 2 weeks. 11/07/18   Couture, Cortni S, PA-C  sucralfate (CARAFATE) 1 g tablet Take 1 tablet (1 g total) by mouth 4 (four) times daily -  with meals and at bedtime for 7 days. 11/07/18 11/14/18  Couture, Cortni S, PA-C    Family History No family history on file.  Social History Social History   Tobacco Use  . Smoking status: Former Smoker  Types: Cigarettes    Quit date: 11/12/2013    Years since quitting: 5.0  . Smokeless tobacco: Never Used  Substance Use Topics  . Alcohol use: No  . Drug use: Yes    Types: Marijuana     Allergies   Patient has no known allergies.   Review of Systems Review of Systems  Gastrointestinal: Positive for abdominal pain.  All other systems reviewed and are negative.    Physical Exam Updated Vital Signs BP (!) 152/115   Pulse (!) 107   Temp 98.8 F (37.1 C) (Oral)   Resp 16   SpO2 98%   Physical Exam Vitals signs and nursing note reviewed.  Constitutional:      General: He  is not in acute distress.    Appearance: He is well-developed.     Comments: Patient appears uncomfortable but nontoxic  HENT:     Head: Atraumatic.  Eyes:     Conjunctiva/sclera: Conjunctivae normal.  Neck:     Musculoskeletal: Neck supple.  Cardiovascular:     Rate and Rhythm: Tachycardia present.  Pulmonary:     Effort: Pulmonary effort is normal.     Breath sounds: Normal breath sounds.  Abdominal:     General: Abdomen is flat. Bowel sounds are decreased.     Palpations: Abdomen is soft.     Tenderness: There is generalized abdominal tenderness. There is guarding. There is no rebound.  Genitourinary:    Penis: Normal.      Scrotum/Testes: Normal.        Right: Tenderness not present.        Left: Tenderness not present.  Skin:    Findings: No rash.  Neurological:     Mental Status: He is alert and oriented to person, place, and time.  Psychiatric:        Mood and Affect: Mood normal.      ED Treatments / Results  Labs (all labs ordered are listed, but only abnormal results are displayed) Labs Reviewed  CBC WITH DIFFERENTIAL/PLATELET - Abnormal; Notable for the following components:      Result Value   WBC 14.8 (*)    Hemoglobin 18.1 (*)    Neutro Abs 13.2 (*)    Abs Immature Granulocytes 0.09 (*)    All other components within normal limits  COMPREHENSIVE METABOLIC PANEL - Abnormal; Notable for the following components:   Sodium 129 (*)    Chloride 93 (*)    CO2 19 (*)    Glucose, Bld 113 (*)    BUN 40 (*)    Creatinine, Ser 2.17 (*)    Calcium 8.4 (*)    Total Protein 6.3 (*)    AST 82 (*)    Alkaline Phosphatase 36 (*)    GFR calc non Af Amer 38 (*)    GFR calc Af Amer 44 (*)    Anion gap 17 (*)    All other components within normal limits  LIPASE, BLOOD - Abnormal; Notable for the following components:   Lipase 68 (*)    All other components within normal limits  URINALYSIS, ROUTINE W REFLEX MICROSCOPIC - Abnormal; Notable for the following  components:   Color, Urine AMBER (*)    APPearance CLOUDY (*)    Glucose, UA 50 (*)    Hgb urine dipstick MODERATE (*)    Protein, ur 30 (*)    Bacteria, UA RARE (*)    All other components within normal limits  RAPID URINE DRUG SCREEN, HOSP  PERFORMED - Abnormal; Notable for the following components:   Cocaine POSITIVE (*)    Amphetamines POSITIVE (*)    Tetrahydrocannabinol POSITIVE (*)    All other components within normal limits    EKG None  Radiology No results found.  Procedures Procedures (including critical care time)  Medications Ordered in ED Medications  morphine 4 MG/ML injection 4 mg (4 mg Intravenous Given 11/29/18 1420)  haloperidol lactate (HALDOL) injection 5 mg (5 mg Intravenous Given 11/29/18 1420)  sodium chloride 0.9 % bolus 1,000 mL (1,000 mLs Intravenous New Bag/Given 11/29/18 1419)  ondansetron (ZOFRAN) injection 4 mg (4 mg Intravenous Given 11/29/18 1420)     Initial Impression / Assessment and Plan / ED Course  I have reviewed the triage vital signs and the nursing notes.  Pertinent labs & imaging results that were available during my care of the patient were reviewed by me and considered in my medical decision making (see chart for details).        BP (!) 152/115   Pulse (!) 107   Temp 98.8 F (37.1 C) (Oral)   Resp 16   SpO2 98%    Final Clinical Impressions(s) / ED Diagnoses   Final diagnoses:  Cannabinoid hyperemesis syndrome [facility](HCC)    ED Discharge Orders    None     2:09 PM Patient here with abdominal pain, nausea vomiting, and history of recurrent marijuana use.  Suspect this is likely to be cannabinoid hyperemesis syndrome.  Nurse did perform an in and out Foley cath to obtain urine and states she was meeting some resistance.  Patient however has intact patellar deep tendon reflex, and symptom not suggestive of spinal cord compression causing his symptom.  Work-up initiated, will provide symptomatic treatment.  Patient has had  multiple ER visit for similar symptoms.  Several abdominal pelvis CT scan was obtained the past without any acute finding.  2:45 PM UA shows moderate Hbg on Urine dipstick, this is likely 2/2 trauma from in and out cath.  No evidence of UTI.    UDS positive for cocaine, amphetamine and tetrahydrocannabinol.  Labs remarkable with sodium of 129, AKI with BUN 40 Cr 2.17. lipase mildly elevated at 68.  WBC 14.8  Pt report feeling better with current treatment which includes IVF, morphine, zofran and Haldol.  No report of CP/SOB.  2:58 PM Will obtain abd/pelvis CT to r/o acute abdominal pathology however strong suspicion for Cannabinoid Hyperemesis Syndrome.  Pt sign out to Dr. Irine SealZammit   Calirose Mccance, Greta DoomBowie, PA-C 11/29/18 1459    Fayrene Helperran, Aadil Sur, PA-C 11/29/18 1511    Raeford RazorKohut, Stephen, MD 11/30/18 [phone]

## 2018-11-29 NOTE — Anesthesia Procedure Notes (Signed)
Procedure Name: Intubation Performed by: Gean Maidens, CRNA Pre-anesthesia Checklist: Patient identified, Emergency Drugs available, Suction available, Patient being monitored and Timeout performed Patient Re-evaluated:Patient Re-evaluated prior to induction Oxygen Delivery Method: Circle system utilized Preoxygenation: Pre-oxygenation with 100% oxygen Induction Type: IV induction and Rapid sequence Laryngoscope Size: Glidescope and 3 Grade View: Grade I Tube type: Oral Tube size: 7.5 mm Number of attempts: 1 Airway Equipment and Method: Stylet Placement Confirmation: ETT inserted through vocal cords under direct vision,  positive ETCO2 and breath sounds checked- equal and bilateral Secured at: 23 cm Tube secured with: Tape Dental Injury: Teeth and Oropharynx as per pre-operative assessment

## 2018-11-29 NOTE — ED Notes (Signed)
Patient transported to surgery via transporter.
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
## 2018-11-29 NOTE — ED Notes (Signed)
Bladder Scan: 158ML

## 2018-11-29 NOTE — ED Notes (Signed)
ED TO INPATIENT HANDOFF REPORT  Name/Age/Gender Corey Atkinson 38 y.o. male  Code Status Code Status History    Date Active Date Inactive Code Status Order ID Comments User Context   03/09/2018 1616 03/10/2018 1742 Full Code [id number]  Ina Homes, MD ED   04/04/2012 0607 04/04/2012 1457 Full Code [id number]  Kalman Drape, MD ED   Advance Care Planning Activity      Home/SNF/Other Home  Chief Complaint abdominal pain constipation  Level of Care/Admitting Diagnosis ED Disposition    ED Disposition Condition Comment   Admit  The patient appears reasonably stabilized for admission considering the current resources, flow, and capabilities available in the ED at this time, and I doubt any other [facility] requiring further screening and/or treatment in the ED prior to admission is  present.       Medical History Past Medical History:  Diagnosis Date  . Cannabis hyperemesis syndrome concurrent with and due to cannabis dependence ([place]) 03/09/2018    Allergies No Known Allergies  IV Location/Drains/Wounds Patient Lines/Drains/Airways Status   Active Line/Drains/Airways    Name:   Placement date:   Placement time:   Site:   Days:   Peripheral IV 11/29/18 Left Forearm   11/29/18    -    Forearm   less than 1          Labs/Imaging Results for orders placed or performed during the hospital encounter of 11/29/18 (from the past 48 hour(s))  CBC with Differential     Status: Abnormal   Collection Time: 11/29/18  1:29 PM  Result Value Ref Range   WBC 14.8 (H) 4.0 - 10.5 K/uL    Comment: WHITE COUNT CONFIRMED ON SMEAR   RBC 5.65 4.22 - 5.81 MIL/uL   Hemoglobin 18.1 (H) 13.0 - 17.0 g/dL   HCT 51.8 39.0 - 52.0 %   MCV 91.7 80.0 - 100.0 fL   MCH 32.0 26.0 - 34.0 pg   MCHC 34.9 30.0 - 36.0 g/dL   RDW 13.1 11.5 - 15.5 %   Platelets 190 150 - 400 K/uL   nRBC 0.0 0.0 - 0.2 %   Neutrophils Relative % 89 %   Neutro Abs 13.2 (H) 1.7 - 7.7 K/uL   Lymphocytes Relative 7 %    Lymphs Abs 1.1 0.7 - 4.0 K/uL   Monocytes Relative 2 %   Monocytes Absolute 0.3 0.1 - 1.0 K/uL   Eosinophils Relative 0 %   Eosinophils Absolute 0.1 0.0 - 0.5 K/uL   Basophils Relative 1 %   Basophils Absolute 0.1 0.0 - 0.1 K/uL   WBC Morphology MILD LEFT SHIFT (1-5% METAS, OCC MYELO, OCC BANDS)    Immature Granulocytes 1 %   Abs Immature Granulocytes 0.09 (H) 0.00 - 0.07 K/uL    Comment: Performed at [facility], [place] [address] [place], [place] [address]  Comprehensive metabolic panel     Status: Abnormal   Collection Time: 11/29/18  1:29 PM  Result Value Ref Range   Sodium 129 (L) 135 - 145 mmol/L   Potassium 4.1 3.5 - 5.1 mmol/L   Chloride 93 (L) 98 - 111 mmol/L   CO2 19 (L) 22 - 32 mmol/L   Glucose, Bld 113 (H) 70 - 99 mg/dL   BUN 40 (H) 6 - 20 mg/dL   Creatinine, Ser 2.17 (H) 0.61 - 1.24 mg/dL   Calcium 8.4 (L) 8.9 - 10.3 mg/dL   Total Protein 6.3 (L) 6.5 - 8.1 g/dL  Albumin 3.6 3.5 - 5.0 g/dL   AST 82 (H) 15 - 41 U/L   ALT 33 0 - 44 U/L   Alkaline Phosphatase 36 (L) 38 - 126 U/L   Total Bilirubin 0.8 0.3 - 1.2 mg/dL   GFR calc non Af Amer 38 (L) >60 mL/min   GFR calc Af Amer 44 (L) >60 mL/min   Anion gap 17 (H) 5 - 15    Comment: Performed at [facility], [address] [place], [place] [address]  Lipase, blood     Status: Abnormal   Collection Time: 11/29/18  1:29 PM  Result Value Ref Range   Lipase 68 (H) 11 - 51 U/L    Comment: Performed at [facility], [address] [place], [place] [address]  Urinalysis, Routine w reflex microscopic     Status: Abnormal   Collection Time: 11/29/18  2:03 PM  Result Value Ref Range   Color, Urine AMBER (A) YELLOW    Comment: BIOCHEMICALS MAY BE AFFECTED BY COLOR   APPearance CLOUDY (A) CLEAR   Specific Gravity, Urine 1.024 1.005 - 1.030   pH 5.0 5.0 - 8.0   Glucose, UA 50 (A) NEGATIVE mg/dL   Hgb urine dipstick MODERATE (A) NEGATIVE   Bilirubin Urine NEGATIVE  NEGATIVE   Ketones, ur NEGATIVE NEGATIVE mg/dL   Protein, ur 30 (A) NEGATIVE mg/dL   Nitrite NEGATIVE NEGATIVE   Leukocytes,Ua NEGATIVE NEGATIVE   RBC / HPF 0-5 0 - 5 RBC/hpf   WBC, UA 0-5 0 - 5 WBC/hpf   Bacteria, UA RARE (A) NONE SEEN   Squamous Epithelial / LPF 0-5 0 - 5   Mucus PRESENT    Hyaline Casts, UA PRESENT     Comment: Performed at [facility], [address] [place], [place] [address]  Rapid urine drug screen (hospital performed)     Status: Abnormal   Collection Time: 11/29/18  2:03 PM  Result Value Ref Range   Opiates NONE DETECTED NONE DETECTED   Cocaine POSITIVE (A) NONE DETECTED   Benzodiazepines NONE DETECTED NONE DETECTED   Amphetamines POSITIVE (A) NONE DETECTED   Tetrahydrocannabinol POSITIVE (A) NONE DETECTED   Barbiturates NONE DETECTED NONE DETECTED    Comment: (NOTE) DRUG SCREEN FOR MEDICAL PURPOSES ONLY.  IF CONFIRMATION IS NEEDED FOR ANY PURPOSE, NOTIFY LAB WITHIN 5 DAYS. LOWEST DETECTABLE LIMITS FOR URINE DRUG SCREEN Drug Class                     Cutoff (ng/mL) Amphetamine and metabolites    1000 Barbiturate and metabolites    200 Benzodiazepine                 200 Tricyclics and metabolites     300 Opiates and metabolites        300 Cocaine and metabolites        300 THC                            50 Performed at [facility], [address] [place], [place] [address]    Ct Renal Stone Study  Result Date: 11/29/2018 CLINICAL DATA:  Acute lower abdominal pain for 3 days, decreased bowel movement, abdominal pressure EXAM: CT ABDOMEN AND PELVIS WITHOUT CONTRAST TECHNIQUE: Multidetector CT imaging of the abdomen and pelvis was performed following the standard protocol without IV contrast. COMPARISON:  11/07/2018 FINDINGS: Lower chest: Dependent  basilar atelectasis. Lower chest pneumomediastinum noted about the lower esophagus and within the anterior mediastinum. Normal heart size. No pericardial effusion.  Hepatobiliary: Limited without IV contrast. No biliary dilatation or obstruction. Gallbladder unremarkable. Pancreas: No focal abnormality or ductal dilatation. Spleen: Small spleen.  No focal abnormality.  Limited assessment. Adrenals/Urinary Tract: Difficult to delineate the adrenal glands without IV contrast. Kidneys demonstrate no acute hydronephrosis or obstruction pattern. Ureters are difficult to assess and separate from adjacent bowel and fluid. No obstructing urinary tract calculus. Bladder is underdistended. Stomach/Bowel: Large volume of abdominopelvic ascites and pneumoperitoneum compatible with perforated viscus. Exam of the bowel is limited without contrast. No obstruction pattern, significant ileus or definite pneumatosis. Slight thickening small-bowel jejunum in the left abdomen, nonspecific. Stool throughout the colon. Appendix not visualized. Vascular/Lymphatic: Limited assessment without IV contrast. No aneurysm. No large bulky adenopathy. Reproductive: No significant finding by CT Other: No hernia evident. Musculoskeletal: No acute osseous finding. IMPRESSION: Large volume of abdominopelvic ascites and pneumoperitoneum compatible with perforated viscus. Associated lower chest pneumomediastinum as well. These results were called by telephone at the time of interpretation on 11/29/2018 at 3:49 pm to Dr. Aileen PilotZammitt, who verbally acknowledged these results. Electronically Signed   By: Judie PetitM.  Shick M.D.   On: 11/29/2018 15:51    Pending Labs Unresulted Labs (From admission, onward)    Start     Ordered   11/29/18 1554  SARS Coronavirus 2 [facility](Hospital order, Performed in [facility] hospital lab) Nasopharyngeal Nasopharyngeal Swab  (Symptomatic/High Risk of Exposure/Tier 1 Patients Labs with Precautions)  Once,   STAT    Question Answer Comment  Is this test for diagnosis or screening Diagnosis of ill patient   Symptomatic for COVID-19 as defined by CDC Yes   Date of Symptom Onset 11/27/2018    Hospitalized for COVID-19 Yes   Admitted to ICU for COVID-19 No   Previously tested for COVID-19 Yes   Resident in a congregate (group) care setting No   Employed in healthcare setting No      11/29/18 1553          Vitals/Pain Today's Vitals   11/29/18 1514 11/29/18 1530 11/29/18 1600 11/29/18 1630  BP:  (!) 147/106 (!) 150/101 (!) 153/113  Pulse:  88 92 85  Resp:    13  Temp:      TempSrc:      SpO2:  100% 98% 97%  PainSc: Asleep       Isolation Precautions Airborne and Contact precautions  Medications Medications  morphine 4 MG/ML injection 4 mg (4 mg Intravenous Given 11/29/18 1420)  haloperidol lactate (HALDOL) injection 5 mg (5 mg Intravenous Given 11/29/18 1420)  sodium chloride 0.9 % bolus 1,000 mL (0 mLs Intravenous Stopped 11/29/18 1600)  ondansetron (ZOFRAN) injection 4 mg (4 mg Intravenous Given 11/29/18 1420)  piperacillin-tazobactam (ZOSYN) IVPB 3.375 g (0 g Intravenous Stopped 11/29/18 1647)  pantoprazole (PROTONIX) injection 40 mg (40 mg Intravenous Given 11/29/18 1609)  sodium chloride 0.9 % bolus 1,000 mL (1,000 mLs Intravenous New Bag/Given 11/29/18 1609)    Mobility walks

## 2018-11-30 ENCOUNTER — Other Ambulatory Visit: Payer: Self-pay

## 2018-11-30 ENCOUNTER — Encounter (HOSPITAL_COMMUNITY): Payer: Self-pay | Admitting: Surgery

## 2018-11-30 LAB — BASIC METABOLIC PANEL
Anion gap: 9 (ref 5–15)
BUN: 37 mg/dL — ABNORMAL HIGH (ref 6–20)
CO2: 22 mmol/L (ref 22–32)
Calcium: 7.8 mg/dL — ABNORMAL LOW (ref 8.9–10.3)
Chloride: 96 mmol/L — ABNORMAL LOW (ref 98–111)
Creatinine, Ser: 1.66 mg/dL — ABNORMAL HIGH (ref 0.61–1.24)
GFR calc Af Amer: >60 mL/min (ref >60)
GFR calc non Af Amer: 52 mL/min — ABNORMAL LOW (ref >60)
Glucose, Bld: 138 mg/dL — ABNORMAL HIGH (ref 70–99)
Potassium: 4.3 mmol/L (ref 3.5–5.1)
Sodium: 127 mmol/L — ABNORMAL LOW (ref 135–145)

## 2018-11-30 LAB — MRSA PCR SCREENING: MRSA by PCR: NEGATIVE

## 2018-11-30 LAB — CBC
HCT: 38.2 % — ABNORMAL LOW (ref 39.0–52.0)
Hemoglobin: 12.9 g/dL — ABNORMAL LOW (ref 13.0–17.0)
MCH: 31.1 pg (ref 26.0–34.0)
MCHC: 33.8 g/dL (ref 30.0–36.0)
MCV: 92 fL (ref 80.0–100.0)
Platelets: 138 10*3/uL — ABNORMAL LOW (ref 150–400)
RBC: 4.15 MIL/uL — ABNORMAL LOW (ref 4.22–5.81)
RDW: 13 % (ref 11.5–15.5)
WBC: 9.8 10*3/uL (ref 4.0–10.5)
nRBC: 0 % (ref 0.0–0.2)

## 2018-11-30 MED ORDER — CHLORHEXIDINE GLUCONATE CLOTH 2 % EX PADS
6.0000 | MEDICATED_PAD | Freq: Every day | CUTANEOUS | Status: DC
  Administered 2018-11-30 – 2018-12-05 (×3): 6 via TOPICAL

## 2018-11-30 MED ORDER — ORAL CARE MOUTH RINSE
15.0000 mL | Freq: Two times a day (BID) | OROMUCOSAL | Status: DC
  Administered 2018-11-30 – 2018-12-05 (×9): 15 mL via OROMUCOSAL

## 2018-11-30 NOTE — Anesthesia Postprocedure Evaluation (Signed)
Anesthesia Post Note  Patient: Corey Atkinson  Procedure(s) Performed: EXPLORATORY LAPAROTOMY WITH OVERSEW OF PYLORIC CHANNEL ULCER IN OMENTAL PATCH (N/A Abdomen)     Patient location during evaluation: PACU Anesthesia Type: General Level of consciousness: awake and alert Pain management: pain level controlled Vital Signs Assessment: post-procedure vital signs reviewed and stable Respiratory status: spontaneous breathing, nonlabored ventilation, respiratory function stable and patient connected to nasal cannula oxygen Cardiovascular status: blood pressure returned to baseline and stable Postop Assessment: no apparent nausea or vomiting Anesthetic complications: no    Last Vitals:  Vitals:   11/29/18 2300 11/30/18 0000  BP: (!) 164/108 (!) 158/116  Pulse: (!) 119 74  Resp: (!) 9 10  Temp:    SpO2: 97% 99%    Last Pain:  Vitals:   11/29/18 2200  TempSrc:   PainSc: 0-No pain                 Barnet Glasgow

## 2018-11-30 NOTE — H&P (Signed)
[facility]:  [phone] General Surgery Progress Note   LOS: 1 day  POD -  1 Day Post-Op  Chief Complaint: Abominal pain  Assessment and Plan: 1.  Laparoscopic OVERSEW OF PYLORIC CHANNEL ULCER and OMENTAL PATCH - 11/29/2018 Ezzard Standing- Rubens Cranston  Zosyn - 8/7 >>>  Looks better today.  2.  ARF  Creatinine - 1.66 3.  Polysubstance abuse 4.  Foley  To remove 4.  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  Much more responsive than last night.  Wants some water.  Objective:   Vitals:   11/30/18 0500 11/30/18 0600  BP: 138/86 (!) 143/92  Pulse: 66 66  Resp: 11 13  Temp:    SpO2: 100% 100%     Intake/Output from previous day:  08/07 0701 - 08/08 0700 In: 4812.7 [I.V.:4762.7; IV Piggyback:50] Out: 1760 [Urine:1145; Emesis/NG output:565; Blood:50]  Intake/Output this shift:  No intake/output data recorded.   Physical Exam:   General: WN AA M who is alert and oriented.   Has NGT.   HEENT: Normal. Pupils equal. .   Lungs: Clear   Abdomen: Firm, quiet.   Wound: Clean   Lab Results:    Recent Labs    11/29/18 2151 11/30/18 0238  WBC 11.1* 9.8  HGB 14.4 12.9*  HCT 42.3 38.2*  PLT 121* 138*    BMET   Recent Labs    11/29/18 2151 11/30/18 0238  NA 126* 127*  K 5.1 4.3  CL 93* 96*  CO2 20* 22  GLUCOSE 120* 138*  BUN 41* 37*  CREATININE 2.30* 1.66*  CALCIUM 7.7* 7.8*    PT/INR  No results for input(s): LABPROT, INR in the last 72 hours.  ABG  No results for input(s): PHART, HCO3 in the last 72 hours.  Invalid input(s): PCO2, PO2   Studies/Results:  Dg Chest Port 1 View  Result Date: 11/29/2018 CLINICAL DATA:  Check gastric catheter placement EXAM: PORTABLE CHEST 1 VIEW  COMPARISON:  None. FINDINGS: Gastric catheter is noted with the tip in the stomach. Proximal side port lies in the distal esophagus. This should be advanced several cm. The known free air and free fluid are not well appreciated on this exam. IMPRESSION: Gastric catheter as described. This should be advanced several cm further into the stomach. Electronically Signed   By: Alcide CleverMark  Lukens M.D.   On: 11/29/2018 21:52   Ct Renal Stone Study  Result Date: 11/29/2018 CLINICAL DATA:  Acute lower abdominal pain for 3 days, decreased bowel movement, abdominal pressure EXAM: CT ABDOMEN AND PELVIS WITHOUT CONTRAST TECHNIQUE: Multidetector CT imaging of the abdomen and pelvis was performed following the standard protocol without IV contrast. COMPARISON:  11/07/2018 FINDINGS: Lower chest: Dependent basilar atelectasis. Lower chest pneumomediastinum noted about the lower esophagus and within the anterior mediastinum. Normal heart size. No pericardial effusion. Hepatobiliary: Limited without IV contrast. No biliary dilatation or obstruction. Gallbladder unremarkable. Pancreas: No focal abnormality or ductal dilatation. Spleen: Small spleen.  No focal abnormality.  Limited assessment. Adrenals/Urinary Tract: Difficult to delineate the adrenal glands without IV contrast. Kidneys demonstrate no acute hydronephrosis or obstruction pattern. Ureters are difficult to assess and separate from adjacent bowel and fluid. No obstructing urinary tract calculus. Bladder is underdistended.  Stomach/Bowel: Large volume of abdominopelvic ascites and pneumoperitoneum compatible with perforated viscus. Exam of the bowel is limited without contrast. No obstruction pattern, significant ileus or definite pneumatosis. Slight thickening small-bowel jejunum in the left abdomen, nonspecific. Stool throughout the colon. Appendix not visualized. Vascular/Lymphatic: Limited assessment without IV contrast. No aneurysm. No large bulky adenopathy. Reproductive:  No significant finding by CT Other: No hernia evident. Musculoskeletal: No acute osseous finding. IMPRESSION: Large volume of abdominopelvic ascites and pneumoperitoneum compatible with perforated viscus. Associated lower chest pneumomediastinum as well. These results were called by telephone at the time of interpretation on 11/29/2018 at 3:49 pm to Dr. Dewayne Hatch, who verbally acknowledged these results. Electronically Signed   By: Jerilynn Mages.  Shick M.D.   On: 11/29/2018 15:51     Anti-infectives:   Anti-infectives (From admission, onward)   Start     Dose/Rate Route Frequency Ordered Stop   11/29/18 2359  piperacillin-tazobactam (ZOSYN) IVPB 3.375 g     3.375 g 12.5 mL/hr over 240 Minutes Intravenous Every 8 hours 11/29/18 2247     11/29/18 2200  piperacillin-tazobactam (ZOSYN) IVPB 3.375 g  Status:  Discontinued     3.375 g 12.5 mL/hr over 240 Minutes Intravenous Every 8 hours 11/29/18 1832 11/29/18 2215   11/29/18 1600  piperacillin-tazobactam (ZOSYN) IVPB 3.375 g     3.375 g 100 mL/hr over 30 Minutes Intravenous  Once 11/29/18 1550 11/29/18 1647      Alphonsa Overall, MD, FACS Pager: [place] [facility] Office: [phone] 11/30/2018

## 2018-12-01 LAB — BASIC METABOLIC PANEL
Anion gap: 6 (ref 5–15)
BUN: 20 mg/dL (ref 6–20)
CO2: 26 mmol/L (ref 22–32)
Calcium: 8.1 mg/dL — ABNORMAL LOW (ref 8.9–10.3)
Chloride: 100 mmol/L (ref 98–111)
Creatinine, Ser: 1.18 mg/dL (ref 0.61–1.24)
GFR calc Af Amer: >60 mL/min (ref >60)
GFR calc non Af Amer: >60 mL/min (ref >60)
Glucose, Bld: 118 mg/dL — ABNORMAL HIGH (ref 70–99)
Potassium: 4.2 mmol/L (ref 3.5–5.1)
Sodium: 132 mmol/L — ABNORMAL LOW (ref 135–145)

## 2018-12-01 LAB — CBC WITH DIFFERENTIAL/PLATELET
Abs Immature Granulocytes: 0.06 10*3/uL (ref 0.00–0.07)
Basophils Absolute: 0 10*3/uL (ref 0.0–0.1)
Basophils Relative: 0 %
Eosinophils Absolute: 0 10*3/uL (ref 0.0–0.5)
Eosinophils Relative: 0 %
HCT: 35.8 % — ABNORMAL LOW (ref 39.0–52.0)
Hemoglobin: 12 g/dL — ABNORMAL LOW (ref 13.0–17.0)
Immature Granulocytes: 1 %
Lymphocytes Relative: 8 %
Lymphs Abs: 0.8 10*3/uL (ref 0.7–4.0)
MCH: 31.4 pg (ref 26.0–34.0)
MCHC: 33.5 g/dL (ref 30.0–36.0)
MCV: 93.7 fL (ref 80.0–100.0)
Monocytes Absolute: 0.4 10*3/uL (ref 0.1–1.0)
Monocytes Relative: 4 %
Neutro Abs: 9.5 10*3/uL — ABNORMAL HIGH (ref 1.7–7.7)
Neutrophils Relative %: 87 %
Platelets: 120 10*3/uL — ABNORMAL LOW (ref 150–400)
RBC: 3.82 MIL/uL — ABNORMAL LOW (ref 4.22–5.81)
RDW: 13.4 % (ref 11.5–15.5)
WBC: 10.9 10*3/uL — ABNORMAL HIGH (ref 4.0–10.5)
nRBC: 0 % (ref 0.0–0.2)

## 2018-12-01 MED ORDER — ENOXAPARIN SODIUM 40 MG/0.4ML ~~LOC~~ SOLN
40.0000 mg | Freq: Every day | SUBCUTANEOUS | Status: DC
  Administered 2018-12-01 – 2018-12-05 (×5): 40 mg via SUBCUTANEOUS
  Filled 2018-12-01 (×4): qty 0.4

## 2018-12-01 NOTE — Progress Notes (Addendum)
[place] [facility] Office:  [phone] General Surgery Progress Note   LOS: 2 days  POD -  2 Days Post-Op  Chief Complaint: Abominal pain  Assessment and Plan: 1.  Laparoscopic OVERSEW OF PYLORIC CHANNEL ULCER and OMENTAL PATCH - 11/29/2018 Lucia Gaskins  Zosyn - 8/7 >>>  Looks good.  Wants NGT out.  Will move to floor.  2.  ARF  Creatinine - 1.18 3.  Polysubstance abuse - which he has denied 4.  DVT prophylaxis - Lovenox   Principal Problem:   Pneumoperitoneum Active Problems:   Cannabis hyperemesis syndrome concurrent with and due to cannabis dependence (HCC)   Cocaine abuse ([place])   Amphetamine abuse ([place])   Acute generalized peritonitis ([place])   Perforated duodenal ulcer ([place])  Subjective:  Continues to look better.  Pleasant personality - much different than when I first met him.  I spoke to his grandmother, Ms. Jeanett Schlein.  Objective:   Vitals:   12/01/18 0500 12/01/18 0600  BP: (!) 164/98 (!) 136/95  Pulse: 60 (!) 57  Resp: 12 (!) 8  Temp:    SpO2: 100% 99%     Intake/Output from previous day:  08/08 0701 - 08/09 0700 In: 4849.3 [I.V.:4699.8; IV Piggyback:149.6] Out: 4385 [Urine:3825; Emesis/NG output:560]  Intake/Output this shift:  No intake/output data recorded.   Physical Exam:   General: WN AA M who is alert and oriented.   Has NGT.   HEENT: Normal. Pupils equal. .   Lungs: Clear, IS - 1,500 cc   Abdomen: Still quiet.   Wound: Clean   Lab Results:    Recent Labs    11/30/18 0238 12/01/18 0202  WBC 9.8 10.9*  HGB 12.9* 12.0*  HCT 38.2* 35.8*  PLT 138* 120*    BMET   Recent Labs    11/30/18 0238 12/01/18 0202  NA 127* 132*  K 4.3 4.2  CL 96* 100  CO2 22 26  GLUCOSE 138* 118*  BUN 37* 20  CREATININE 1.66* 1.18  CALCIUM 7.8* 8.1*    PT/INR  No results for input(s): LABPROT, INR in the last 72 hours.  ABG  No results for input(s): PHART, HCO3 in the last 72 hours.  Invalid input(s): PCO2, PO2   Studies/Results:  Dg Chest  Port 1 View  Result Date: 11/29/2018 CLINICAL DATA:  Check gastric catheter placement EXAM: PORTABLE CHEST 1 VIEW COMPARISON:  None. FINDINGS: Gastric catheter is noted with the tip in the stomach. Proximal side port lies in the distal esophagus. This should be advanced several cm. The known free air and free fluid are not well appreciated on this exam. IMPRESSION: Gastric catheter as described. This should be advanced several cm further into the stomach. Electronically Signed   By: Inez Catalina M.D.   On: 11/29/2018 21:52   Ct Renal Stone Study  Result Date: 11/29/2018 CLINICAL DATA:  Acute lower abdominal pain for 3 days, decreased bowel movement, abdominal pressure EXAM: CT ABDOMEN AND PELVIS WITHOUT CONTRAST TECHNIQUE: Multidetector CT imaging of the abdomen and pelvis was performed following the standard protocol without IV contrast. COMPARISON:  11/07/2018 FINDINGS: Lower chest: Dependent basilar atelectasis. Lower chest pneumomediastinum noted about the lower esophagus and within the anterior mediastinum. Normal heart size. No pericardial effusion. Hepatobiliary: Limited without IV contrast. No biliary dilatation or obstruction. Gallbladder unremarkable. Pancreas: No focal abnormality or ductal dilatation. Spleen: Small spleen.  No focal abnormality.  Limited assessment. Adrenals/Urinary Tract: Difficult to delineate the adrenal glands without IV contrast. Kidneys demonstrate no  acute hydronephrosis or obstruction pattern. Ureters are difficult to assess and separate from adjacent bowel and fluid. No obstructing urinary tract calculus. Bladder is underdistended. Stomach/Bowel: Large volume of abdominopelvic ascites and pneumoperitoneum compatible with perforated viscus. Exam of the bowel is limited without contrast. No obstruction pattern, significant ileus or definite pneumatosis. Slight thickening small-bowel jejunum in the left abdomen, nonspecific. Stool throughout the colon. Appendix not visualized.  Vascular/Lymphatic: Limited assessment without IV contrast. No aneurysm. No large bulky adenopathy. Reproductive: No significant finding by CT Other: No hernia evident. Musculoskeletal: No acute osseous finding. IMPRESSION: Large volume of abdominopelvic ascites and pneumoperitoneum compatible with perforated viscus. Associated lower chest pneumomediastinum as well. These results were called by telephone at the time of interpretation on 11/29/2018 at 3:49 pm to Dr. Dewayne Hatch, who verbally acknowledged these results. Electronically Signed   By: Jerilynn Mages.  Shick M.D.   On: 11/29/2018 15:51     Anti-infectives:   Anti-infectives (From admission, onward)   Start     Dose/Rate Route Frequency Ordered Stop   11/29/18 2359  piperacillin-tazobactam (ZOSYN) IVPB 3.375 g     3.375 g 12.5 mL/hr over 240 Minutes Intravenous Every 8 hours 11/29/18 2247     11/29/18 2200  piperacillin-tazobactam (ZOSYN) IVPB 3.375 g  Status:  Discontinued     3.375 g 12.5 mL/hr over 240 Minutes Intravenous Every 8 hours 11/29/18 1832 11/29/18 2215   11/29/18 1600  piperacillin-tazobactam (ZOSYN) IVPB 3.375 g     3.375 g 100 mL/hr over 30 Minutes Intravenous  Once 11/29/18 1550 11/29/18 1647      Alphonsa Overall, MD, FACS Pager: [place] [facility] Office: [phone] 12/01/2018

## 2018-12-01 NOTE — Progress Notes (Signed)
Patient to transfer to 5W 1540 report given to receiving nurse, all questions answered at this time.  Pt. VSS with no s/s of distress noted.  Pt. Stable at transfer.

## 2018-12-02 LAB — CBC WITH DIFFERENTIAL/PLATELET
Abs Immature Granulocytes: 0.04 10*3/uL (ref 0.00–0.07)
Basophils Absolute: 0 10*3/uL (ref 0.0–0.1)
Basophils Relative: 0 %
Eosinophils Absolute: 0 10*3/uL (ref 0.0–0.5)
Eosinophils Relative: 0 %
HCT: 36.7 % — ABNORMAL LOW (ref 39.0–52.0)
Hemoglobin: 12.4 g/dL — ABNORMAL LOW (ref 13.0–17.0)
Immature Granulocytes: 0 %
Lymphocytes Relative: 7 %
Lymphs Abs: 0.7 10*3/uL (ref 0.7–4.0)
MCH: 31.9 pg (ref 26.0–34.0)
MCHC: 33.8 g/dL (ref 30.0–36.0)
MCV: 94.3 fL (ref 80.0–100.0)
Monocytes Absolute: 0.7 10*3/uL (ref 0.1–1.0)
Monocytes Relative: 7 %
Neutro Abs: 9 10*3/uL — ABNORMAL HIGH (ref 1.7–7.7)
Neutrophils Relative %: 86 %
Platelets: 142 10*3/uL — ABNORMAL LOW (ref 150–400)
RBC: 3.89 MIL/uL — ABNORMAL LOW (ref 4.22–5.81)
RDW: 13.2 % (ref 11.5–15.5)
WBC: 10.5 10*3/uL (ref 4.0–10.5)
nRBC: 0 % (ref 0.0–0.2)

## 2018-12-02 LAB — BASIC METABOLIC PANEL
Anion gap: 5 (ref 5–15)
BUN: 13 mg/dL (ref 6–20)
CO2: 29 mmol/L (ref 22–32)
Calcium: 8.4 mg/dL — ABNORMAL LOW (ref 8.9–10.3)
Chloride: 97 mmol/L — ABNORMAL LOW (ref 98–111)
Creatinine, Ser: 1.1 mg/dL (ref 0.61–1.24)
GFR calc Af Amer: >60 mL/min (ref >60)
GFR calc non Af Amer: >60 mL/min (ref >60)
Glucose, Bld: 126 mg/dL — ABNORMAL HIGH (ref 70–99)
Potassium: 4.3 mmol/L (ref 3.5–5.1)
Sodium: 131 mmol/L — ABNORMAL LOW (ref 135–145)

## 2018-12-02 MED ORDER — ACETAMINOPHEN 10 MG/ML IV SOLN
1000.0000 mg | Freq: Four times a day (QID) | INTRAVENOUS | Status: AC
  Administered 2018-12-02 – 2018-12-03 (×4): 1000 mg via INTRAVENOUS
  Filled 2018-12-02 (×4): qty 100

## 2018-12-02 MED ORDER — MENTHOL 3 MG MT LOZG: 1.0000 | LOZENGE | OROMUCOSAL | Status: DC | PRN

## 2018-12-02 NOTE — Progress Notes (Signed)
[facility]/Trauma Progress Note  3 Days Post-Op   Assessment/Plan Pyloric channel ulcer, pneumoperitoneum - S/P laparoscopic oversew of pyloric channel ulcer and omental patch, Dr. Ezzard StandingNewman, 08/07. POD#3 - continue NGtr Polysubstance abuse - tox screen + for amphetamines, cocaine, and tetrahydrocannabinol - ETOH neg AKI - baseline creatinine appears to be around 0.97, today it is 1.10 Hyponatremia - IVF, monitor  Lipase - 68 on admission Hypertension - PRN hydralazine   FEN: NPO, IVF, protonix VTE: SCD's, Lovenox ID: Zosyn 08/07>> Foley: none  Follow up: TBD  Plan: UGI tomorrow to check for leak. Pt is at high risk for abscess. Encourage ambulation and IS.    LOS: 3 days    Subjective: CC: abdominal pain  Pt states not much pain at rest but increased pain with movement. No flatus or BM. He is urinating without issues. He denies dysuria or hematuria.  He has been ambulating.  He denies fever chills.  Discussed with patient that he is at increased risk for abscess formation.  Objective: Vital signs in last 24 hours: Temp:  [99.3 F (37.4 C)-99.9 F (37.7 C)] 99.9 F (37.7 C) (08/10 0453) Pulse Rate:  [55-58] 56 (08/10 0453) Resp:  [14-18] 17 (08/10 0453) BP: (138-154)/(88-97) 140/88 (08/10 0453) SpO2:  [98 %-100 %] 99 % (08/10 0453) Last BM Date: (pt unaware of last BM date. States he was constipated before)  Intake/Output from previous day: 08/09 0701 - 08/10 0700 In: 3654.3 [P.O.:120; I.V.:3421.5; IV Piggyback:112.8] Out: 2400 [Urine:1750; Emesis/NG output:650] Intake/Output this shift: No intake/output data recorded.  PE: Gen:  Alert, NAD, pleasant, cooperative HEENT: NG tube in place with dark yellow output Card:  RRR, no M/G/R heard Pulm: Mildly diminished breath sounds RLL otherwise CTA, no W/R/R, rate and effort normal Abd: Soft, mild distention, few BS, incisions with glue intact appear well and are without drainage, bleeding, or signs of  infection.  Generalized TTP.  No peritonitis Skin: no rashes noted, warm and dry   Anti-infectives: Anti-infectives (From admission, onward)   Start     Dose/Rate Route Frequency Ordered Stop   11/29/18 2359  piperacillin-tazobactam (ZOSYN) IVPB 3.375 g     3.375 g 12.5 mL/hr over 240 Minutes Intravenous Every 8 hours 11/29/18 2247     11/29/18 2200  piperacillin-tazobactam (ZOSYN) IVPB 3.375 g  Status:  Discontinued     3.375 g 12.5 mL/hr over 240 Minutes Intravenous Every 8 hours 11/29/18 1832 11/29/18 2215   11/29/18 1600  piperacillin-tazobactam (ZOSYN) IVPB 3.375 g     3.375 g 100 mL/hr over 30 Minutes Intravenous  Once 11/29/18 1550 11/29/18 1647      Lab Results:  Recent Labs    12/01/18 0202 12/02/18 0402  WBC 10.9* 10.5  HGB 12.0* 12.4*  HCT 35.8* 36.7*  PLT 120* 142*   BMET Recent Labs    12/01/18 0202 12/02/18 0402  NA 132* 131*  K 4.2 4.3  CL 100 97*  CO2 26 29  GLUCOSE 118* 126*  BUN 20 13  CREATININE 1.18 1.10  CALCIUM 8.1* 8.4*   PT/INR No results for input(s): LABPROT, INR in the last 72 hours. CMP     Component Value Date/Time   NA 131 (L) 12/02/2018 0402   K 4.3 12/02/2018 0402   CL 97 (L) 12/02/2018 0402   CO2 29 12/02/2018 0402   GLUCOSE 126 (H) 12/02/2018 0402   BUN 13 12/02/2018 0402   CREATININE 1.10 12/02/2018 0402   CALCIUM 8.4 (L) 12/02/2018 0402  PROT 6.3 (L) 11/29/2018 1329   ALBUMIN 3.6 11/29/2018 1329   AST 82 (H) 11/29/2018 1329   ALT 33 11/29/2018 1329   ALKPHOS 36 (L) 11/29/2018 1329   BILITOT 0.8 11/29/2018 1329   GFRNONAA >60 12/02/2018 0402   GFRAA >60 12/02/2018 0402   Lipase     Component Value Date/Time   LIPASE 68 (H) 11/29/2018 1329    Studies/Results: No results found.    Kalman Drape , [facility] 12/02/2018, 8:12 AM  Pager: [phone] Mon-Wed, Friday 7:00am-4:30pm Thurs 7am-11:30am  Consults: [phone]

## 2018-12-03 ENCOUNTER — Inpatient Hospital Stay (HOSPITAL_COMMUNITY): Payer: Self-pay

## 2018-12-03 MED ORDER — IOHEXOL 300 MG/ML  SOLN
50.0000 mL | Freq: Once | INTRAMUSCULAR | Status: AC | PRN
  Administered 2018-12-03: 50 mL via ORAL

## 2018-12-03 MED ORDER — ACETAMINOPHEN 10 MG/ML IV SOLN
1000.0000 mg | Freq: Four times a day (QID) | INTRAVENOUS | Status: DC
  Administered 2018-12-03: 1000 mg via INTRAVENOUS
  Filled 2018-12-03 (×2): qty 100

## 2018-12-03 MED ORDER — OXYCODONE HCL 5 MG PO TABS
5.0000 mg | ORAL_TABLET | ORAL | Status: DC | PRN
  Administered 2018-12-03: 17:00:00 5 mg via ORAL
  Administered 2018-12-03 – 2018-12-04 (×5): 10 mg via ORAL
  Filled 2018-12-03 (×6): qty 2

## 2018-12-03 MED ORDER — ACETAMINOPHEN 10 MG/ML IV SOLN: 1000.0000 mg | Freq: Four times a day (QID) | INTRAVENOUS | Status: DC

## 2018-12-03 MED ORDER — MORPHINE SULFATE (PF) 2 MG/ML IV SOLN: 1.0000 mg | INTRAVENOUS | Status: DC | PRN

## 2018-12-03 MED ORDER — ACETAMINOPHEN 325 MG PO TABS
650.0000 mg | ORAL_TABLET | Freq: Four times a day (QID) | ORAL | Status: DC
  Administered 2018-12-03 – 2018-12-05 (×6): 650 mg via ORAL
  Filled 2018-12-03 (×8): qty 2

## 2018-12-03 NOTE — Progress Notes (Signed)
4 Days Post-Op  Subjective: CC: Abdominal pain Patient reports that his abdominal pain improved a great deal yesterday with IV Tylenol. Notes pain is generalized and worse with palpation and movement. He denies fever, chills, or nausea. No flatus or BM. He reports that he has been urinating frequently but denies dysuria or hematuria. He has been ambulating  Objective: Vital signs in last 24 hours: Temp:  [98.6 F (37 C)-98.8 F (37.1 C)] 98.6 F (37 C) (08/11 0510) Pulse Rate:  [54-64] 64 (08/11 0510) Resp:  [17-18] 18 (08/11 0510) BP: (134-154)/(92-99) 154/99 (08/11 0510) SpO2:  [100 %] 100 % (08/11 0510) Last BM Date: 11/23/18  Intake/Output from previous day: 08/10 0701 - 08/11 0700 In: 3158.5 [P.O.:120; I.V.:2450.5; IV Piggyback:588] Out: 2700 [Urine:2250; Emesis/NG output:450] Intake/Output this shift: No intake/output data recorded.  PE: Gen:  Alert, NAD, pleasant, cooperative HEENT: NG tube in place with dark yellow output Card:  RRR, no M/G/R heard Pulm: Mild diminished breath sounds LL's otherwise CTA, no W/R/R, rate and effort normal Abd: Soft, mild distention, few BS, incisions with dermabond intact and are without drainage, bleeding, or signs of infection.  Generalized TTP.  No peritonitis Skin: no rashes noted, warm and dry  Lab Results:  Recent Labs    12/01/18 0202 12/02/18 0402  WBC 10.9* 10.5  HGB 12.0* 12.4*  HCT 35.8* 36.7*  PLT 120* 142*   BMET Recent Labs    12/01/18 0202 12/02/18 0402  NA 132* 131*  K 4.2 4.3  CL 100 97*  CO2 26 29  GLUCOSE 118* 126*  BUN 20 13  CREATININE 1.18 1.10  CALCIUM 8.1* 8.4*   PT/INR No results for input(s): LABPROT, INR in the last 72 hours. CMP     Component Value Date/Time   NA 131 (L) 12/02/2018 0402   K 4.3 12/02/2018 0402   CL 97 (L) 12/02/2018 0402   CO2 29 12/02/2018 0402   GLUCOSE 126 (H) 12/02/2018 0402   BUN 13 12/02/2018 0402   CREATININE 1.10 12/02/2018 0402   CALCIUM 8.4 (L)  12/02/2018 0402   PROT 6.3 (L) 11/29/2018 1329   ALBUMIN 3.6 11/29/2018 1329   AST 82 (H) 11/29/2018 1329   ALT 33 11/29/2018 1329   ALKPHOS 36 (L) 11/29/2018 1329   BILITOT 0.8 11/29/2018 1329   GFRNONAA >60 12/02/2018 0402   GFRAA >60 12/02/2018 0402   Lipase     Component Value Date/Time   LIPASE 68 (H) 11/29/2018 1329       Studies/Results: No results found.  Anti-infectives: Anti-infectives (From admission, onward)   Start     Dose/Rate Route Frequency Ordered Stop   11/29/18 2359  piperacillin-tazobactam (ZOSYN) IVPB 3.375 g     3.375 g 12.5 mL/hr over 240 Minutes Intravenous Every 8 hours 11/29/18 2247     11/29/18 2200  piperacillin-tazobactam (ZOSYN) IVPB 3.375 g  Status:  Discontinued     3.375 g 12.5 mL/hr over 240 Minutes Intravenous Every 8 hours 11/29/18 1832 11/29/18 2215   11/29/18 1600  piperacillin-tazobactam (ZOSYN) IVPB 3.375 g     3.375 g 100 mL/hr over 30 Minutes Intravenous  Once 11/29/18 1550 11/29/18 1647       Assessment/Plan Pyloric channel ulcer,pneumoperitoneum - S/P laparoscopic oversew of pyloric channel ulcer and omental patch, Dr. Ezzard StandingNewman, 08/07. POD# 4 - continue NGT - UGI Polysubstance abuse - tox screen + for amphetamines, cocaine, and tetrahydrocannabinol - ETOH neg AKI- baseline creatinine appears to be around 0.97, yesterday it  was 1.10 Hyponatremia- IVF, monitor  Lipase- 68 on admission Hypertension- PRN hydralazine   FEN:NPO, IVF, Protonix VTE: SCD's, Lovenox XT:GGYIR 08/07>> Foley:none Follow up:TBD  Plan:UGI to check for leak. Pt is at high risk for abscess. Encourage ambulation and IS.    LOS: 4 days    Jillyn Ledger , [facility] 12/03/2018, 7:56 AM Pager: [phone]

## 2018-12-04 LAB — BASIC METABOLIC PANEL
Anion gap: 7 (ref 5–15)
BUN: 10 mg/dL (ref 6–20)
CO2: 25 mmol/L (ref 22–32)
Calcium: 8.6 mg/dL — ABNORMAL LOW (ref 8.9–10.3)
Chloride: 99 mmol/L (ref 98–111)
Creatinine, Ser: 0.97 mg/dL (ref 0.61–1.24)
GFR calc Af Amer: >60 mL/min (ref >60)
GFR calc non Af Amer: >60 mL/min (ref >60)
Glucose, Bld: 102 mg/dL — ABNORMAL HIGH (ref 70–99)
Potassium: 4.1 mmol/L (ref 3.5–5.1)
Sodium: 131 mmol/L — ABNORMAL LOW (ref 135–145)

## 2018-12-04 LAB — CBC
HCT: 37.8 % — ABNORMAL LOW (ref 39.0–52.0)
Hemoglobin: 12.9 g/dL — ABNORMAL LOW (ref 13.0–17.0)
MCH: 31 pg (ref 26.0–34.0)
MCHC: 34.1 g/dL (ref 30.0–36.0)
MCV: 90.9 fL (ref 80.0–100.0)
Platelets: 136 10*3/uL — ABNORMAL LOW (ref 150–400)
RBC: 4.16 MIL/uL — ABNORMAL LOW (ref 4.22–5.81)
RDW: 12.6 % (ref 11.5–15.5)
WBC: 9.8 10*3/uL (ref 4.0–10.5)
nRBC: 0 % (ref 0.0–0.2)

## 2018-12-04 MED ORDER — POLYETHYLENE GLYCOL 3350 17 G PO PACK
17.0000 g | PACK | Freq: Every day | ORAL | Status: DC | PRN
  Administered 2018-12-04: 09:00:00 17 g via ORAL
  Filled 2018-12-04: qty 1

## 2018-12-04 MED ORDER — BISACODYL 10 MG RE SUPP
10.0000 mg | Freq: Every day | RECTAL | Status: DC | PRN
  Administered 2018-12-04 – 2018-12-05 (×2): 10 mg via RECTAL
  Filled 2018-12-04 (×2): qty 1

## 2018-12-04 MED ORDER — DOCUSATE SODIUM 100 MG PO CAPS
100.0000 mg | ORAL_CAPSULE | Freq: Two times a day (BID) | ORAL | Status: DC
  Administered 2018-12-04 – 2018-12-05 (×3): 100 mg via ORAL
  Filled 2018-12-04 (×3): qty 1

## 2018-12-04 NOTE — Progress Notes (Signed)
5 Days Post-Op  Subjective: CC: Abdominal pain Patient reports that his abdominal pain has significantly improved from yesterday. Very minimal soreness that is generalized. He reports he began passing flatus when he was down getting a UGI yesterday. No BM. NGT was removed yesterday. Tolerated CLD without N/V, worsening abdominal pain or distension. Mobilized in halls x 4 laps yesterday. He states he wants to eat more solid food today.   Objective: Vital signs in last 24 hours: Temp:  [98.6 F (37 C)-99 F (37.2 C)] 98.8 F (37.1 C) (08/12 8299) Pulse Rate:  [60-78] 68 (08/12 0633) Resp:  [18] 18 (08/12 0633) BP: (134-154)/(82-99) 134/82 (08/12 0633) SpO2:  [99 %-100 %] 99 % (08/12 0633) Last BM Date: 11/23/18  Intake/Output from previous day: 08/11 0701 - 08/12 0700 In: 4219.7 [P.O.:2760; I.V.:1362.6; IV Piggyback:97.2] Out: 3500 [Urine:3500] Intake/Output this shift: No intake/output data recorded.  PE: Gen: Alert, NAD, pleasant, cooperative Card: RRR, no M/G/R heard Pulm:CTA b/l. No W/R/R,rate andeffort normal Abd: Soft,no distention, normoactive BS,incisions with dermabond intact and are without drainage,bleeding,or signs of infection. Less generalized TTP. No peritonitis Skin: no rashes noted, warm and dry  Lab Results:  Recent Labs    12/02/18 0402 12/04/18 0316  WBC 10.5 9.8  HGB 12.4* 12.9*  HCT 36.7* 37.8*  PLT 142* 136*   BMET Recent Labs    12/02/18 0402 12/04/18 0316  NA 131* 131*  K 4.3 4.1  CL 97* 99  CO2 29 25  GLUCOSE 126* 102*  BUN 13 10  CREATININE 1.10 0.97  CALCIUM 8.4* 8.6*   PT/INR No results for input(s): LABPROT, INR in the last 72 hours. CMP     Component Value Date/Time   NA 131 (L) 12/04/2018 0316   K 4.1 12/04/2018 0316   CL 99 12/04/2018 0316   CO2 25 12/04/2018 0316   GLUCOSE 102 (H) 12/04/2018 0316   BUN 10 12/04/2018 0316   CREATININE 0.97 12/04/2018 0316   CALCIUM 8.6 (L) 12/04/2018 0316   PROT 6.3  (L) 11/29/2018 1329   ALBUMIN 3.6 11/29/2018 1329   AST 82 (H) 11/29/2018 1329   ALT 33 11/29/2018 1329   ALKPHOS 36 (L) 11/29/2018 1329   BILITOT 0.8 11/29/2018 1329   GFRNONAA >60 12/04/2018 0316   GFRAA >60 12/04/2018 0316   Lipase     Component Value Date/Time   LIPASE 68 (H) 11/29/2018 1329       Studies/Results: Dg Ugi W Single Cm (sol Or Thin Ba)  Result Date: 12/03/2018 CLINICAL DATA:  Water-soluble contrast EXAM: UPPER GI SERIES WITH KUB TECHNIQUE: After obtaining a scout radiograph a routine upper GI series was performed using water-soluble contrast FLUOROSCOPY TIME:  Fluoroscopy Time:  1.8 minutes Radiation Exposure Index (if provided by the fluoroscopic device): 55.7 Number of Acquired Spot Images: 12 CONTRAST:  50 cc Omnipaque COMPARISON:  CT 11/29/2018 FINDINGS: Initial KUB demonstrates NG tube in stomach. Gas distended transverse colon. Water-soluble contrast was taken PO. Contrast flowed readily into the stomach. Stomach is normal orientation. Contrast flows through the distal stomach and pylorus without evidence of leak or obstruction. Contrast flowed into proximal small bowel to the level of ligament of Treitz. IMPRESSION: No evidence gastric leak following p.o. water-soluble contrast. Contrast flows into the proximal small bowel. These results will be called to the ordering clinician or representative by the Radiologist Assistant, and communication documented in the PACS or zVision Dashboard. Electronically Signed   By: Suzy Bouchard M.D.   On:  12/03/2018 09:01    Anti-infectives: Anti-infectives (From admission, onward)   Start     Dose/Rate Route Frequency Ordered Stop   11/29/18 2359  piperacillin-tazobactam (ZOSYN) IVPB 3.375 g     3.375 g 12.5 mL/hr over 240 Minutes Intravenous Every 8 hours 11/29/18 2247     11/29/18 2200  piperacillin-tazobactam (ZOSYN) IVPB 3.375 g  Status:  Discontinued     3.375 g 12.5 mL/hr over 240 Minutes Intravenous Every 8 hours  11/29/18 1832 11/29/18 2215   11/29/18 1600  piperacillin-tazobactam (ZOSYN) IVPB 3.375 g     3.375 g 100 mL/hr over 30 Minutes Intravenous  Once 11/29/18 1550 11/29/18 1647       Assessment/Plan Pyloric channel ulcer,pneumoperitoneum -S/P laparoscopic oversew of pyloric channel ulcer and omental patch, Dr. Ezzard StandingNewman, 08/07. POD# 5 - UGI negative for leak - Tolerating CLD. Advance to FLD - Bowel regimen  - Mobilize and IS Polysubstance abuse - tox screen + for amphetamines, cocaine, and tetrahydrocannabinol - ETOHneg AKI- baseline creatinine appears to be around 0.97, today 0.97 - resolved.  Hyponatremia- IVF, monitor Lipase- 68on admission Hypertension- PRN hydralazine   FEN:FLD, IVF, Protonix VTE: SCD's,Lovenox ZO:[id number]>>:Zosyn08/07>> Foley:none Follow up:TBD  Plan:Advance diet. Mobilize and IS. Bowel regimen. Moving towards d/c.    LOS: 5 days    Jacinto HalimMichael M Shannan Slinker , [facility] 12/04/2018, 7:54 AM Pager: [phone]

## 2018-12-05 MED ORDER — DOCUSATE SODIUM 100 MG PO CAPS: 100.0000 mg | ORAL_CAPSULE | Freq: Two times a day (BID) | ORAL | 0 refills | Status: DC

## 2018-12-05 MED ORDER — ACETAMINOPHEN 325 MG PO TABS: 650.0000 mg | ORAL_TABLET | Freq: Four times a day (QID) | ORAL | Status: AC | PRN

## 2018-12-05 MED ORDER — ONDANSETRON 4 MG PO TBDP: 4.0000 mg | ORAL_TABLET | Freq: Four times a day (QID) | ORAL | 0 refills | Status: DC | PRN

## 2018-12-05 MED ORDER — PANTOPRAZOLE SODIUM 40 MG PO TBEC: 40.0000 mg | DELAYED_RELEASE_TABLET | Freq: Two times a day (BID) | ORAL | 2 refills | Status: DC

## 2018-12-05 MED ORDER — OXYCODONE HCL 5 MG PO TABS: 5.0000 mg | ORAL_TABLET | Freq: Four times a day (QID) | ORAL | 0 refills | Status: DC | PRN

## 2018-12-05 NOTE — Discharge Summary (Signed)
Patient ID: Corey Atkinson [id number] 07/30/1980 38 y.o.  Admit date: 11/29/2018 Discharge date: 12/05/2018  Admitting Diagnosis: Perforated viscus, pneumoperitoneum Polysubstance abuse AKI  Hyponatremia  Hypertension   Discharge Diagnosis Patient Active Problem List   Diagnosis Date Noted  . Pneumoperitoneum 11/29/2018  . Cocaine abuse ([place]) 11/29/2018  . 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 Acute generalized peritonitis ([place]) 11/29/2018  . Perforated duodenal ulcer ([place]) 11/29/2018  . Abdominal pain, diffuse 03/09/2018  . Nausea & vomiting 03/09/2018  . Cannabis hyperemesis syndrome concurrent with and due to cannabis dependence ([place]) 03/09/2018    Consultants None  Procedures Dr. Lucia Gaskins - Exploratory Laparoscopy with oversew of pyloric channel ulcer in omental patch - 11/29/2018  H&P: Patient is a 38 year old otherwise healthy male with a history of marijuana abuse who presented to the ED with complaints of abdominal pain. Pt states he has been having generalized abdominal pain for months relieved with food. Pain got significantly worse in the last 3 days with associated nausea and vomiting. He states generalized, non radiating, severe, constant, abdominal pain, nothing makes it better and movement makes it worse. He denies daily medications, blood thinners and has no hx of previous surgery. Pt denies NSAID or ETOH use. Pt was very sleepy during my exam so hx is limited.   Hospital Course:  Patient underwent workup in ED and was found to have large volume of abdominopelvic ascites and pneumoperitoneum compatible with perforated viscus. He was also noted to have AKI, hyponatremia, and UDS + for Cocaine, Amphetamines and THC. He was admitted to General Surgery service, started on broad spectrum abx and was taken to the OR by Dr. Lucia Gaskins as above. Patient tolerated the procedure well. He was started on Protonix BID. Kidney function improved and AKI resolved. Patient had NGT  remain in place until POD 4 when a UGI confirmed there was no leak. Patients NG tube was removed and diet was advanced. On POD 6, the patient was voiding well, tolerating diet, ambulating well, pain well controlled, vital signs stable, incisions c/d/i and felt stable for discharge home. He has follow up as below. We discussed importance of cessation of drug use, avoiding NSAIDs and dietary recommendations. He is to continue taking Protonix BID at home. 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   Physical Exam: Gen: Alert, NAD, pleasant, cooperative Card: RRR, no M/G/R heard Pulm:CTA b/l. No W/R/R,rate andeffort normal Abd: Soft,no distention, normoactive BS,incisions withdermabondintact and are without drainage,bleeding,or signs of infection. No TTP.  Skin: no rashes noted, warm and dry  Allergies as of 12/05/2018   No Known Allergies     Medication List    STOP taking these medications   dicyclomine 20 MG tablet Commonly known as: BENTYL   polyethylene glycol 17 g packet Commonly known as: MiraLax     TAKE these medications   acetaminophen 325 MG tablet Commonly known as: TYLENOL Take 2 tablets (650 mg total) by mouth every 6 (six) hours as needed.   docusate sodium 100 MG capsule Commonly known as: COLACE Take 1 capsule (100 mg total) by mouth 2 (two) times daily.   famotidine 20 MG tablet Commonly known as: PEPCID Take 1 tablet (20 mg total) by mouth 2 (two) times daily for 7 days.   ondansetron 4 MG disintegrating tablet Commonly known as: ZOFRAN-ODT Take 1 tablet (4 mg total) by mouth every 6 (six) hours as needed for nausea. What changed:  when to take this  reasons to take this   oxyCODONE 5 MG immediate release tablet Commonly known as: Oxy IR/ROXICODONE Take 1 tablet (5 mg total) by mouth every 6 (six) hours as needed for moderate pain or breakthrough pain.   pantoprazole 40 MG tablet Commonly known as:  Protonix Take 1 tablet (40 mg total) by mouth 2 (two) times daily. What changed:   medication strength  how much to take  when to take this   sucralfate 1 g tablet Commonly known as: Carafate Take 1 tablet (1 g total) by mouth 4 (four) times daily -  with meals and at bedtime for 7 days.        Follow-up Information    Ovidio KinNewman, David, MD. Schedule an appointment as soon as possible for a visit on 12/25/2018.   Specialty: General Surgery Why: @915am . Please call to make an appointment for 3 weeks. Please arrive 30 minutes prior to your appointment for paperwork. Please bring a copy of your photo ID and insurance card to the appointment.  Contact information: [address] [place] [id number] [phone]        Primary Care Provider Follow up.   Why: Please establish care with a primary care provider. Your blood pressure was elevated at times during your hospital visit. Please have it checked at their office to see if this requires medication.           Signed: Leary RocaMichael , [facility] 12/05/2018, 9:20 AM Pager: [phone]

## 2018-12-05 NOTE — Discharge Instructions (Signed)
[facility]  Please arrive at least 30 min before your appointment to complete your check in paperwork.  If you are unable to arrive 30 min prior to your appointment time we may have to cancel or reschedule you. LAPAROSCOPIC SURGERY: POST OP INSTRUCTIONS Always review your discharge instruction sheet given to you by the facility where your surgery was performed. IF YOU HAVE DISABILITY OR FAMILY LEAVE FORMS, YOU MUST BRING THEM TO THE OFFICE FOR PROCESSING.   DO NOT GIVE THEM TO YOUR DOCTOR.  PAIN CONTROL  1. First take acetaminophen (Tylenol) to control your pain after surgery.  Follow directions on package.  Taking acetaminophen (Tylenol) regularly after surgery will help to control your pain and lower the amount of prescription pain medication you may need.  You should not take more than 4,000 mg (4 grams) of acetaminophen (Tylenol) in 24 hours.  You should not take ibuprofen (Advil), aleve, motrin, naprosyn or other NSAIDS if you have a history of stomach ulcers.  2. A prescription for pain medication may be given to you upon discharge.  Take your pain medication as prescribed, if you still have uncontrolled pain after taking acetaminophen (Tylenol)  3. Use ice packs to help control pain. 4. If you need a refill on your pain medication, please contact your pharmacy.  They will contact our office to request authorization. Prescriptions will not be filled after 5pm or on week-ends.  HOME MEDICATIONS 5. Take your usually prescribed medications unless otherwise directed.  DIET 6. You should follow a light diet the first few days after arrival home.  Be sure to include lots of fluids daily. Avoid fatty, fried foods.   CONSTIPATION 7. It is common to experience some constipation after surgery and if you are taking pain medication.  Increasing fluid intake and taking a stool softener (such as Colace) will usually help or prevent this problem from occurring.  A mild laxative  (Milk of Magnesia or Miralax) should be taken according to package instructions if there are no bowel movements after 48 hours.  WOUND/INCISION CARE 8. Most patients will experience some swelling and bruising in the area of the incisions.  Ice packs will help.  Swelling and bruising can take several days to resolve.  9. Unless discharge instructions indicate otherwise, follow guidelines below  a. STERI-STRIPS - you may remove your outer bandages 48 hours after surgery, and you may shower at that time.  You have steri-strips (small skin tapes) in place directly over the incision.  These strips should be left on the skin for 7-10 days.   b. DERMABOND/SKIN GLUE - you may shower in 24 hours.  The glue will flake off over the next 2-3 weeks. 10. Any sutures or staples will be removed at the office during your follow-up visit.  ACTIVITIES 11. You may resume regular (light) daily activities beginning the next day--such as daily self-care, walking, climbing stairs--gradually increasing activities as tolerated.  You may have sexual intercourse when it is comfortable.  Refrain from any heavy lifting or straining until approved by your doctor. a. You may drive when you are no longer taking prescription pain medication, you can comfortably wear a seatbelt, and you can safely maneuver your car and apply brakes.  FOLLOW-UP 12. You should see your doctor in the office for a follow-up appointment approximately 2-3 weeks after your surgery.  You should have been given your post-op/follow-up appointment when your surgery was scheduled.  If you did not receive a post-op/follow-up  appointment, make sure that you call for this appointment within a day or two after you arrive home to insure a convenient appointment time.   WHEN TO CALL YOUR DOCTOR: 1. Fever over 101.0 2. Inability to urinate 3. Continued bleeding from incision. 4. Increased pain, redness, or drainage from the incision. 5. Increasing abdominal  pain  The clinic staff is available to answer your questions during regular business hours.  Please dont hesitate to call and ask to speak to one of the nurses for clinical concerns.  If you have a medical emergency, go to the nearest emergency room or call 911.  A surgeon from [facility] is always on call at the hospital. [address], [facility] [address], [place], [place]  [id number] ? [address] [address], [place], [place]   [id number] [phone] ? [phone] ? FAX [phone]    Bland Diet A bland diet consists of foods that are often soft and do not have a lot of fat, fiber, or extra seasonings. Foods without fat, fiber, or seasoning are easier for the body to digest. They are also less likely to irritate your mouth, throat, stomach, and other parts of your digestive system. A bland diet is sometimes called a BRAT diet. What is my plan? Your health care provider or food and nutrition specialist (dietitian) may recommend specific changes to your diet to prevent symptoms or to treat your symptoms. These changes may include:  Eating small meals often.  Cooking food until it is soft enough to chew easily.  Chewing your food well.  Drinking fluids slowly.  Not eating foods that are very spicy, sour, or fatty.  Not eating citrus fruits, such as oranges and grapefruit. What do I need to know about this diet?  Eat a variety of foods from the bland diet food list.  Do not follow a bland diet longer than needed.  Ask your health care provider whether you should take vitamins or supplements. What foods can I eat? Grains  Hot cereals, such as cream of wheat. Rice. Bread, crackers, or tortillas made from refined white flour. Vegetables Canned or cooked vegetables. Mashed or boiled potatoes. Fruits  Bananas. Applesauce. Other types of cooked or canned fruit with the skin and seeds removed, such as canned peaches or pears. Meats and other proteins  Scrambled eggs. Creamy  peanut butter or other nut butters. Lean, well-cooked meats, such as chicken or fish. Tofu. Soups or broths. Dairy Low-fat dairy products, such as milk, cottage cheese, or yogurt. Beverages  Water. Herbal tea. Apple juice. Fats and oils Mild salad dressings. Canola or olive oil. Sweets and desserts Pudding. Custard. Fruit gelatin. Ice cream. The items listed above may not be a complete list of recommended foods and beverages. Contact a dietitian for more options. What foods are not recommended? Grains Whole grain breads and cereals. Vegetables Raw vegetables. Fruits Raw fruits, especially citrus, berries, or dried fruits. Dairy Whole fat dairy foods. Beverages Caffeinated drinks. Alcohol. Seasonings and condiments Strongly flavored seasonings or condiments. Hot sauce. Salsa. Other foods Spicy foods. Fried foods. Sour foods, such as pickled or fermented foods. Foods with high sugar content. Foods high in fiber. The items listed above may not be a complete list of foods and beverages to avoid. Contact a dietitian for more information. Summary  A bland diet consists of foods that are often soft and do not have a lot of fat, fiber, or extra seasonings.  Foods without fat, fiber, or seasoning are easier for  the body to digest.  Check with your health care provider to see how long you should follow this diet plan. It is not meant to be followed for long periods. This information is not intended to replace advice given to you by your health care provider. Make sure you discuss any questions you have with your health care provider. Document Released: 08/02/2015 Document Revised: 05/09/2017 Document Reviewed: 05/09/2017 Elsevier Patient Education  2020 [employer]vier Inc.  Peptic Ulcer  A peptic ulcer is a sore in the lining of the stomach (gastric ulcer) or the first part of the small intestine (duodenal ulcer). The ulcer causes a gradual wearing away (erosion) of the deeper tissue. What  are the causes? Normally, the lining of the stomach and the small intestine protects them from the acid that digests food. The protective lining can be damaged by:  An infection caused by a type of bacteria called Helicobacter pylori or H. pylori.  Regular use of NSAIDs, such as ibuprofen or aspirin.  Rare tumors in the stomach, small intestine, or pancreas (Zollinger-Ellison syndrome). What increases the risk? The following factors may make you more likely to develop this condition:  Smoking.  Having a family history of ulcer disease.  Drinking alcohol.  Having been hospitalized in an intensive care unit (ICU). What are the signs or symptoms? Symptoms of this condition include:  Persistent burning pain in the area between the chest and the belly button. The pain may be worse on an empty stomach and at night.  Heartburn.  Nausea and vomiting.  Bloating. If the ulcer results in bleeding, it can cause:  Black, tarry stools.  Vomiting of bright red blood.  Vomiting of material that looks like coffee grounds. How is this diagnosed? This condition may be diagnosed based on:  Your medical history and a physical exam.  Various tests or procedures, such as: ? Blood tests, stool tests, or breath tests to check for the H. pylori bacteria. ? An X-ray exam (upper gastrointestinal series) of the esophagus, stomach, and small intestine. ? Upper endoscopy. The health care provider examines the esophagus, stomach, and small intestine using a small flexible tube that has a video camera at the end. ? Biopsy. A tissue sample is removed to be examined under a microscope. How is this treated? Treatment for this condition may include:  Eliminating the cause of the ulcer, such as smoking or use of NSAIDs, and limiting alcohol and caffeine intake.  Medicines to reduce the amount of acid in your digestive tract.  Antibiotic medicines, if the ulcer is caused by an H. pylori infection.  An  upper endoscopy may be used to treat a bleeding ulcer.  Surgery. This may be needed if the bleeding is severe or if the ulcer created a hole somewhere in the digestive system. Follow these instructions at home:  Do not drink alcohol if your health care provider tells you not to drink.  Do not use any products that contain nicotine or tobacco, such as cigarettes, e-cigarettes, and chewing tobacco. If you need help quitting, ask your health care provider.  Take over-the-counter and prescription medicines only as told by your health care provider. ? Do not use over-the-counter medicines in place of prescription medicines unless your health care provider approves. ? Do not take aspirin, ibuprofen, or other NSAIDs unless your health care provider told you to do so.  Take over-the-counter and prescription medicines only as told by your health care provider.  Keep all follow-up visits as told  by your health care provider. This is important. Contact a health care provider if:  Your symptoms do not improve within 7 days of starting treatment.  You have ongoing indigestion or heartburn. Get help right away if:  You have sudden, sharp, or persistent pain in your abdomen.  You have bloody or dark black, tarry stools.  You vomit blood or material that looks like coffee grounds.  You become light-headed or you feel faint.  You become weak.  You become sweaty or clammy. Summary  A peptic ulcer is a sore in the lining of the stomach (gastric ulcer) or the first part of the small intestine (duodenal ulcer). The ulcer causes a gradual wearing away (erosion) of the deeper tissue.  Do not use any products that contain nicotine or tobacco, such as cigarettes, e-cigarettes, and chewing tobacco. If you need help quitting, ask your health care provider.  Take over-the-counter and prescription medicines only as told by your health care provider. Do not use over-the-counter medicines in place of  prescription medicines unless your health care provider approves.  Contact your health care provider if you have ongoing indigestion or heartburn.  Keep all follow-up visits as told by your health care provider. This is important. This information is not intended to replace advice given to you by your health care provider. Make sure you discuss any questions you have with your health care provider. Document Released: 04/07/2000 Document Revised: 10/16/2017 Document Reviewed: 10/16/2017 Elsevier Patient Education  2020 [employer]

## 2018-12-05 NOTE — Progress Notes (Signed)
The pt was provided with d/c instructions. After discussing the pt's plan of care upon d/c home, the pt reported no further questions or concerns.  

## 2018-12-16 ENCOUNTER — Encounter: Payer: Self-pay | Admitting: Nurse Practitioner

## 2018-12-26 ENCOUNTER — Encounter: Payer: Self-pay | Admitting: Nurse Practitioner

## 2018-12-26 ENCOUNTER — Other Ambulatory Visit: Payer: Self-pay

## 2018-12-26 ENCOUNTER — Other Ambulatory Visit (INDEPENDENT_AMBULATORY_CARE_PROVIDER_SITE_OTHER): Payer: Self-pay

## 2018-12-26 ENCOUNTER — Ambulatory Visit (INDEPENDENT_AMBULATORY_CARE_PROVIDER_SITE_OTHER): Payer: Self-pay | Admitting: Nurse Practitioner

## 2018-12-26 VITALS — BP 124/74 | HR 88 | Temp 98.5°F | Ht 76.0 in | Wt 176.2 lb

## 2018-12-26 DIAGNOSIS — K279 Peptic ulcer, site unspecified, unspecified as acute or chronic, without hemorrhage or perforation: Secondary | ICD-10-CM

## 2018-12-26 LAB — H. PYLORI ANTIBODY, IGG: H Pylori IgG: NEGATIVE

## 2018-12-26 NOTE — Progress Notes (Signed)
Agree with the assessment and plan as outlined by Paul Guenther, NP.  Vito Cirigliano, DO, FACG   

## 2018-12-26 NOTE — Progress Notes (Signed)
ASSESSMENT / PLAN:   751. 38 year old male admitted last month with perforated pyloric channel ulcer, status post upper scopic oversew ulcer / omental patch.  Etiology of ulcer not clear.  Patient adamantly denies even moderate use of NSAIDs. -Check H. Pylori IgG -Due to finances patient is not taking a PPI, he was discharged from hospital on Protonix.  I have recommended Prilosec OTC 20 mg twice daily to start ASAP -Avoid all NSAIDs for now -We will schedule patient for EGD to be done sometime in the next couple of weeks. The risks and benefits of EGD were discussed and the patient agrees to proceed.   2.  Abnormal urine drug screen.  Patient admits to smoking marijuana.  Cocaine and amphetamines were found on UDS but patient adamantly denies use of either   HPI:    Chief Complaint:   Recent perforated gastric ulcer  Patient is a 38 year old male with history of HTN, polysubstance abuse, hypertension, recent perforated duodenal ulcer.   Patient was admitted early August with nausea, vomiting, generalized abdominal pain.  In the ED CT AP showed large volume ascites and pneumoperitoneum.  He had AKI.  His UDS was positive for cocaine, amphetamines and THC.he admits to smoking weed every now and then but adamantly denies any other illicit drug use . He underwent laparoscopic oversew of pyloric channel ulcer and omental patch by Dr. Ezzard StandingNewman. Discharged on Protonix 40 mg twice daily, Carafate 4 times daily.  Advised to avoid NSAIDs. Patient says he rarely takes NSAIDs.  At best, takes 1-2 doses a month. No previous hx of PUD. Patient has no chronic GI complaints.   Patient saw [facility] in follow-up on 12/12/2018. Fritzi MandesQuentin feels fine, no abdominal pain, nausea nor vomiting. [facility] was concerned patient was not taking Protonix due to cost.  In fact he is NOT taking any prescribed meds due to cost.     Past Medical History:  Diagnosis Date  . 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 LAPAROTOMY N/A 11/29/2018   Procedure: EXPLORATORY LAPAROTOMY WITH OVERSEW OF PYLORIC CHANNEL ULCER IN OMENTAL PATCH;  Surgeon: Ovidio KinNewman, David, MD;  Location: [facility];  Service: General;  Laterality: N/A;   Family History  Problem Relation Age of Onset  . Stomach cancer Maternal Aunt   . Colon cancer Neg Hx   . Liver cancer Neg Hx    Social History   Tobacco Use  . Smoking status: Former Smoker    Types: Cigarettes    Quit date: 11/12/2013    Years since quitting: 5.1  . Smokeless tobacco: Never Used  Substance Use Topics  . Alcohol use: No  . Drug use: Not Currently    Types: Marijuana   Current Outpatient Medications  Medication Sig Dispense Refill  . acetaminophen (TYLENOL) 325 MG tablet Take 2 tablets (650 mg total) by mouth every 6 (six) hours as needed.    . pantoprazole (PROTONIX) 40 MG tablet Take 1 tablet (40 mg total) by mouth 2 (two) times daily. (Patient not taking: Reported on 12/26/2018) 60 tablet 2   No current facility-administered medications for this visit.    No Known Allergies   Review of Systems: Positive for muscle pain . All systems reviewed and negative except where noted in HPI.   Physical Exam:    Wt  Readings from Last 3 Encounters:  12/26/18 176 lb 4 oz (79.9 kg)  11/29/18 185 lb (83.9 kg)  11/06/18 185 lb (83.9 kg)    BP 124/74   Pulse 88   Temp 98.5 F (36.9 C)   Ht 6\' 4"  (1.93 m)   Wt 176 lb 4 oz (79.9 kg)   BMI 21.45 kg/m  Constitutional:  Pleasant male in no acute distress. Psychiatric: Normal mood and affect. Behavior is normal. EENT: Pupils normal.  Conjunctivae are normal. No scleral icterus. Neck supple.  Cardiovascular: Normal rate, regular rhythm. No edema Pulmonary/chest: Effort normal and breath sounds normal. No wheezing, rales or rhonchi. Abdominal: Soft, nondistended, nontender. Bowel sounds active throughout. There  are no masses palpable. No hepatomegaly. Neurological: Alert and oriented to person place and time. Skin: Skin is warm and dry. No rashes noted.  Tye Savoy, NP  12/26/2018, 11:19 AM  Cc: Alphonsa Overall, MD

## 2018-12-26 NOTE — Patient Instructions (Signed)
If you are age 36 or older, your body mass index should be between 23-30. Your Body mass index is 21.45 kg/m. If this is out of the aforementioned range listed, please consider follow up with your Primary Care Provider.  If you are age 53 or younger, your body mass index should be between 19-25. Your Body mass index is 21.45 kg/m. If this is out of the aformentioned range listed, please consider follow up with your Primary Care Provider.   You have been scheduled for an endoscopy. Please follow written instructions given to you at your visit today. If you use inhalers (even only as needed), please bring them with you on the day of your procedure. Your physician has requested that you go to www.startemmi.com and enter the access code given to you at your visit today. This web site gives a general overview about your procedure. However, you should still follow specific instructions given to you by our office regarding your preparation for the procedure.  Your provider has requested that you go to the basement level for lab work before leaving today. Press "B" on the elevator. The lab is located at the first door on the left as you exit the elevator.  Start Prilosec 20 mg twice daily.  ( this is over-the-counter)  NO NSAIDS (I.e. Motrin, Advil, Ibuprofen etc.)  Thank you for choosing me and [facility].   Tye Savoy, NP

## 2019-01-14 ENCOUNTER — Encounter: Payer: Self-pay | Admitting: Gastroenterology

## 2019-01-29 ENCOUNTER — Other Ambulatory Visit: Payer: Self-pay

## 2019-01-29 DIAGNOSIS — Z1159 Encounter for screening for other viral diseases: Secondary | ICD-10-CM

## 2019-02-04 ENCOUNTER — Telehealth: Payer: Self-pay

## 2019-02-04 NOTE — Telephone Encounter (Signed)
Covid-19 screening questions   Do you now or have you had a fever in the last 14 days? NO   Do you have any respiratory symptoms of shortness of breath or cough now or in the last 14 days? NO  Do you have any family members or close contacts with diagnosed or suspected Covid-19 in the past 14 days? NO  Have you been tested for Covid-19 and found to be positive? NO        

## 2019-02-05 ENCOUNTER — Telehealth: Payer: Self-pay | Admitting: *Deleted

## 2019-02-05 ENCOUNTER — Other Ambulatory Visit: Payer: Self-pay

## 2019-02-05 ENCOUNTER — Encounter: Payer: Self-pay | Admitting: Gastroenterology

## 2019-02-05 NOTE — Telephone Encounter (Signed)
New instructions mailed to pt. 

## 2019-02-18 ENCOUNTER — Telehealth: Payer: Self-pay

## 2019-02-18 NOTE — Telephone Encounter (Signed)
Covid-19 screening questions   Do you now or have you had a fever in the last 14 days? NO   Do you have any respiratory symptoms of shortness of breath or cough now or in the last 14 days? NO  Do you have any family members or close contacts with diagnosed or suspected Covid-19 in the past 14 days? NO  Have you been tested for Covid-19 and found to be positive? NO        

## 2019-02-19 ENCOUNTER — Encounter: Payer: Self-pay | Admitting: Gastroenterology

## 2019-02-19 ENCOUNTER — Other Ambulatory Visit: Payer: Self-pay

## 2019-02-19 ENCOUNTER — Ambulatory Visit (AMBULATORY_SURGERY_CENTER): Payer: Self-pay | Admitting: Gastroenterology

## 2019-02-19 ENCOUNTER — Other Ambulatory Visit: Payer: Self-pay | Admitting: Gastroenterology

## 2019-02-19 VITALS — BP 132/82 | HR 66 | Temp 98.5°F | Resp 27 | Ht 76.0 in | Wt 176.0 lb

## 2019-02-19 DIAGNOSIS — K2980 Duodenitis without bleeding: Secondary | ICD-10-CM

## 2019-02-19 DIAGNOSIS — K259 Gastric ulcer, unspecified as acute or chronic, without hemorrhage or perforation: Secondary | ICD-10-CM

## 2019-02-19 DIAGNOSIS — K2970 Gastritis, unspecified, without bleeding: Secondary | ICD-10-CM

## 2019-02-19 MED ORDER — SODIUM CHLORIDE 0.9 % IV SOLN: 500.0000 mL | INTRAVENOUS | Status: DC

## 2019-02-19 NOTE — Op Note (Signed)
[facility] Patient Name: Corey Atkinson Procedure Date: 02/19/2019 3:58 PM MRN: [id number] Endoscopist: Gerrit Heck , MD Age: 38 Referring MD:  Date of Birth: 1980/06/18 Gender: Male Account #: [id number] Procedure:                Upper GI endoscopy Indications:              Follow-up of gastric ulcer                           38 yo male admitted in 11/2018 with perforated                            pyloric channel ulcer, treated with omental patch                            and oversew, presents today for endoscopic                            evaluation. He is otherwise without any GI symptoms. Medicines:                Monitored Anesthesia Care Procedure:                Pre-Anesthesia Assessment:                           - Prior to the procedure, a History and Physical                            was performed, and patient medications and                            allergies were reviewed. The patient's tolerance of                            previous anesthesia was also reviewed. The risks                            and benefits of the procedure and the sedation                            options and risks were discussed with the patient.                            All questions were answered, and informed consent                            was obtained. Prior Anticoagulants: The patient has                            taken no previous anticoagulant or antiplatelet                            agents. ASA Grade Assessment: II - A patient with  mild systemic disease. After reviewing the risks                            and benefits, the patient was deemed in                            satisfactory condition to undergo the procedure.                           After obtaining informed consent, the endoscope was                            passed under direct vision. Throughout the                            procedure, the patient's blood  pressure, pulse, and                            oxygen saturations were monitored continuously. The                            Endoscope was introduced through the mouth, and                            advanced to the second part of duodenum. The upper                            GI endoscopy was somewhat difficult due to presence                            of food. The patient tolerated the procedure well. Scope In: Scope Out: Findings:                 The examined esophagus was normal.                           A large amount of food (residue) was found in the                            gastric fundus and in the gastric body. This was                            largely greasy, oil, fatty type consistency. This                            obscured complete visualization of the gastric                            mucosa.                           Diffuse mild inflammation characterized by erythema  was found in the gastric body and in the gastric                            antrum. No ulcers noted on limited views. Biopsies                            were taken with a cold forceps for histology.                            Estimated blood loss was minimal.                           Localized mildly erythematous mucosa without active                            bleeding and with no stigmata of bleeding was found                            in the duodenal bulb. Biopsies were taken with a                            cold forceps for histology. Estimated blood loss                            was minimal.                           There was a visible suture in the duodenal bulb.                           The second portion of the duodenum was normal. Complications:            No immediate complications. Estimated Blood Loss:     Estimated blood loss was minimal. Impression:               - Normal esophagus.                           - A large amount of retained food  (residue) in the                            stomach. Query gastroparesis.                           - Gastritis. Biopsied.                           - Erythematous duodenopathy. Biopsied.                           - Visible suture in the duodenal bulb.                           - Normal second portion of the duodenum. Recommendation:           - Patient has a  contact number available for                            emergencies. The signs and symptoms of potential                            delayed complications were discussed with the                            patient. Return to normal activities tomorrow.                            Written discharge instructions were provided to the                            patient.                           - Resume previous diet.                           - Continue present medications.                           - Resume Prilosec 20 mg PO BID.                           - Await pathology results.                           - Do a gastric emptying study at appointment to be                            scheduled.                           - Return to GI clinic at appointment to be                            scheduled. Doristine Locks, MD 02/19/2019 4:20:10 PM

## 2019-02-19 NOTE — Patient Instructions (Addendum)
THE BIOPSIES REMOVED TODAY HAVE BEEN SENT FOR PATHOLOGY.  THE RESULTS CAN TAKE 2-3 WEEKS TO RECEIVE.    YOU MAY RESUME YOUR PREVIOUS DIET AND MEDICATION SCHEDULE.  PLEASE CONTINUE THE PRILOSEC 20mg  BY MOUTH TWICE A DAY.   THANKS FOR ALLOWING [place] TO CARE FOR YOU TODAY!!  YOU HAD AN ENDOSCOPIC PROCEDURE TODAY AT [facility]:   Refer to the procedure report that was given to you for any specific questions about what was found during the examination.  If the procedure report does not answer your questions, please call your gastroenterologist to clarify.  If you requested that your care partner not be given the details of your procedure findings, then the procedure report has been included in a sealed envelope for you to review at your convenience later.  YOU SHOULD EXPECT: Some feelings of bloating in the abdomen. Passage of more gas than usual.  Walking can help get rid of the air that was put into your GI tract during the procedure and reduce the bloating. If you had a lower endoscopy (such as a colonoscopy or flexible sigmoidoscopy) you may notice spotting of blood in your stool or on the toilet paper. If you underwent a bowel prep for your procedure, you may not have a normal bowel movement for a few days.  Please Note:  You might notice some irritation and congestion in your nose or some drainage.  This is from the oxygen used during your procedure.  There is no need for concern and it should clear up in a day or so.  SYMPTOMS TO REPORT IMMEDIATELY:   Following upper endoscopy (EGD)  Vomiting of blood or coffee ground material  New chest pain or pain under the shoulder blades  Painful or persistently difficult swallowing  New shortness of breath  Fever of 100F or higher  Black, tarry-looking stools  For urgent or emergent issues, a gastroenterologist can be reached at any hour by calling [phone].   DIET:  We do recommend a small meal at first, but then you may  proceed to your regular diet.  Drink plenty of fluids but you should avoid alcoholic beverages for 24 hours.  ACTIVITY:  You should plan to take it easy for the rest of today and you should NOT DRIVE or use heavy machinery until tomorrow (because of the sedation medicines used during the test).    FOLLOW UP: Our staff will call the number listed on your records 48-72 hours following your procedure to check on you and address any questions or concerns that you may have regarding the information given to you following your procedure. If we do not reach you, we will leave a message.  We will attempt to reach you two times.  During this call, we will ask if you have developed any symptoms of COVID 19. If you develop any symptoms (ie: fever, flu-like symptoms, shortness of breath, cough etc.) before then, please call [phone].  If you test positive for Covid 19 in the 2 weeks post procedure, please call and report this information to [place].    If any biopsies were taken you will be contacted by phone or by letter within the next 1-3 weeks.  Please call us at [phone] if you have not heard about the biopsies in 3 weeks.    SIGNATURES/CONFIDENTIALITY: You and/or your care partner have signed paperwork which will be entered into your electronic medical record.  These signatures attest to the fact that that  the information above on your After Visit Summary has been reviewed and is understood.  Full responsibility of the confidentiality of this discharge information lies with you and/or your care-partner.

## 2019-02-19 NOTE — Progress Notes (Signed)
VS- Courtney Washington Temp- Lisa Clapps 

## 2019-02-19 NOTE — Progress Notes (Signed)
Called to room to assist during endoscopic procedure.  Patient ID and intended procedure confirmed with present staff. Received instructions for my participation in the procedure from the performing physician.  

## 2019-02-19 NOTE — Progress Notes (Signed)
Report to PACU, RN, vss, BBS= Clear.  

## 2019-02-21 ENCOUNTER — Telehealth: Payer: Self-pay

## 2019-02-21 NOTE — Telephone Encounter (Signed)
Follow up call attempted.  NALM  

## 2019-02-21 NOTE — Telephone Encounter (Signed)
Attempted to reach patient for post-procedure f/u call. No answer. Left message for him not to hesitate to call us if he has any questions/concerns regarding his care.

## 2019-02-25 ENCOUNTER — Other Ambulatory Visit: Payer: Self-pay

## 2019-02-25 DIAGNOSIS — A048 Other specified bacterial intestinal infections: Secondary | ICD-10-CM

## 2019-02-25 MED ORDER — METRONIDAZOLE 250 MG PO TABS: 250.0000 mg | ORAL_TABLET | Freq: Four times a day (QID) | ORAL | 0 refills | Status: AC

## 2019-02-25 MED ORDER — BISMUTH SUBSALICYLATE 262 MG PO TABS: 2.0000 | ORAL_TABLET | Freq: Four times a day (QID) | ORAL | 0 refills | Status: AC

## 2019-02-25 MED ORDER — OMEPRAZOLE 20 MG PO CPDR: 20.0000 mg | DELAYED_RELEASE_CAPSULE | Freq: Two times a day (BID) | ORAL | 0 refills | Status: AC

## 2019-02-25 MED ORDER — DOXYCYCLINE HYCLATE 100 MG PO TABS: 100.0000 mg | ORAL_TABLET | Freq: Two times a day (BID) | ORAL | 0 refills | Status: AC

## 2019-02-25 NOTE — Addendum Note (Signed)
Addended by: Mohammed Kindle on: 02/25/2019 02:35 PM   Modules accepted: Orders

## 2019-04-04 ENCOUNTER — Ambulatory Visit: Payer: Self-pay | Admitting: Gastroenterology

## 2020-12-11 IMAGING — CT CT ABDOMEN AND PELVIS WITH CONTRAST
2 of 4 series · 16 of 46 positions shown, 18 images · IV contrast (Omni 300)
Comparison: 05/19/2018 CT abdomen and pelvis.

CLINICAL DATA: 37 y/o M; left upper quadrant abdominal pain.
History of intussusception.

EXAM:
CT ABDOMEN AND PELVIS WITH CONTRAST
TECHNIQUE: Multidetector CT imaging of the abdomen and pelvis was performed
using the standard protocol following bolus administration of
intravenous contrast.
CONTRAST:  100mL OMNIPAQUE IOHEXOL 300 MG/ML  SOLN

[Series 3: a/p w/ 5mm · axial · 0.70mm/px · z∈[+777,+1182]mm · 13 of 89 slices shown, 15 images]
[im 4/89  soft-tissue]
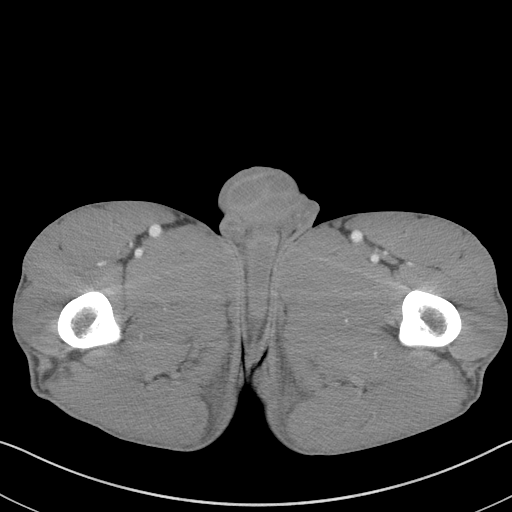
[im 4/89  bone]
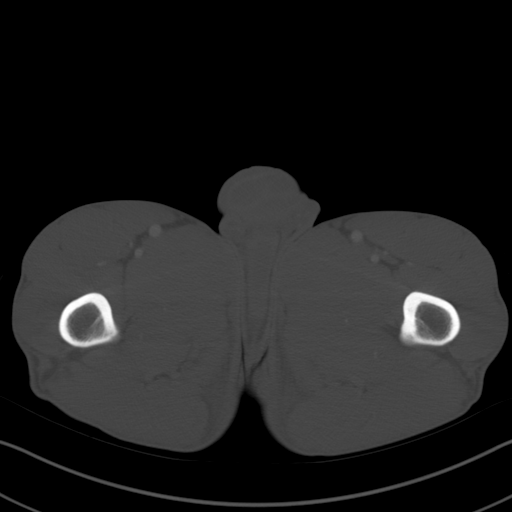
[im 11/89  soft-tissue]
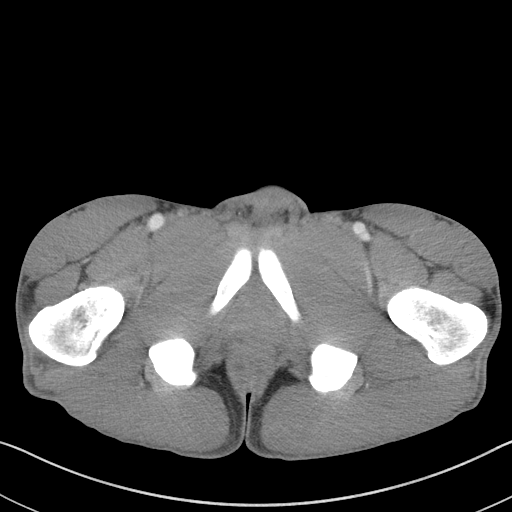
[im 18/89  soft-tissue]
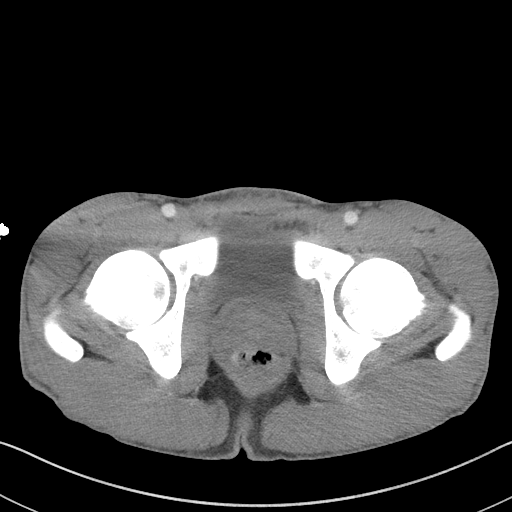
[im 25/89  soft-tissue]
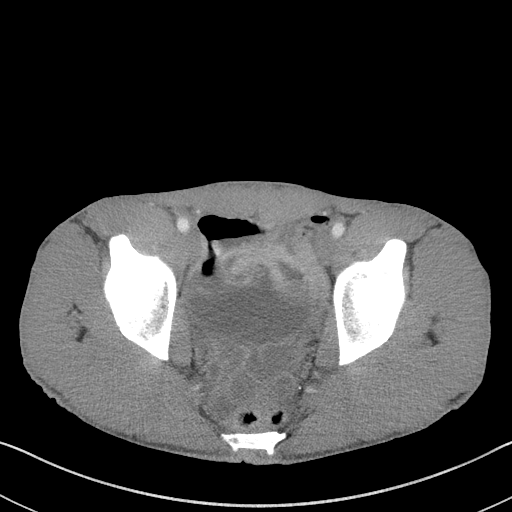
[im 32/89  soft-tissue]
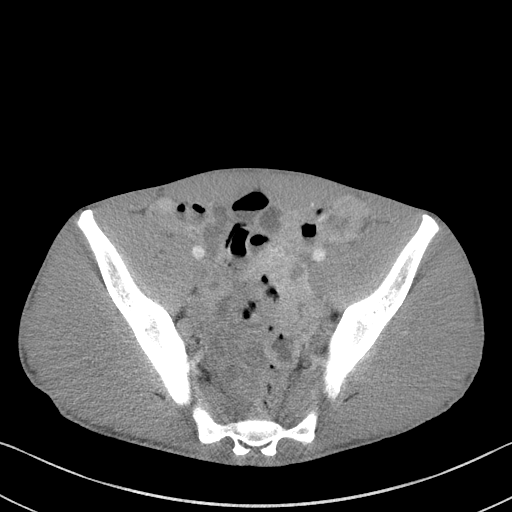
[im 39/89  soft-tissue]
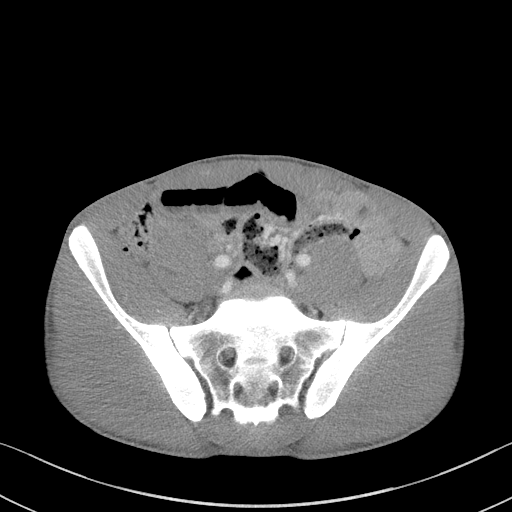
[im 46/89  soft-tissue]
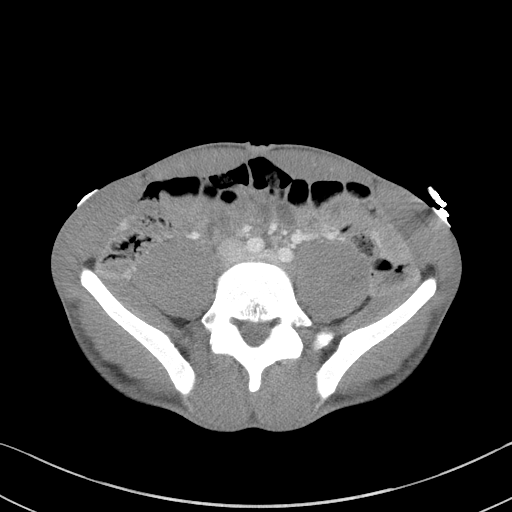
[im 50/89  soft-tissue]
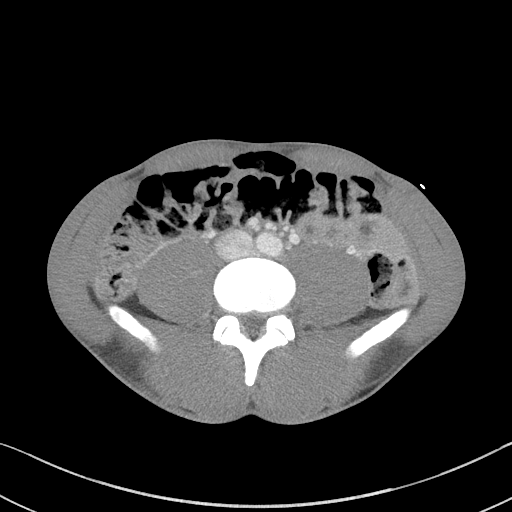
[im 57/89  soft-tissue]
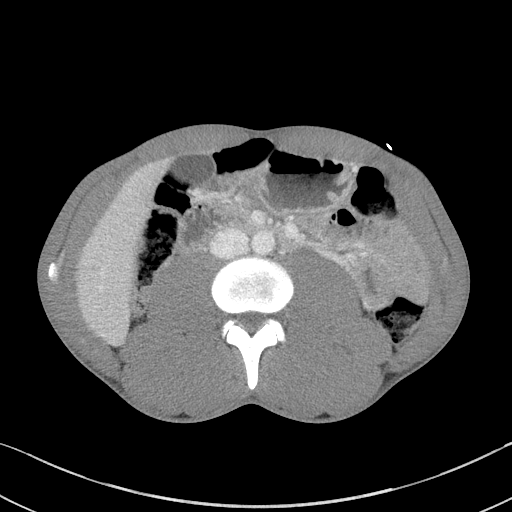
[im 57/89  bone]
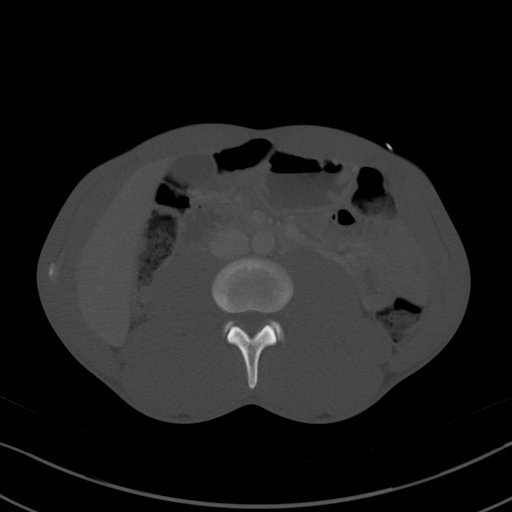
[im 64/89  soft-tissue]
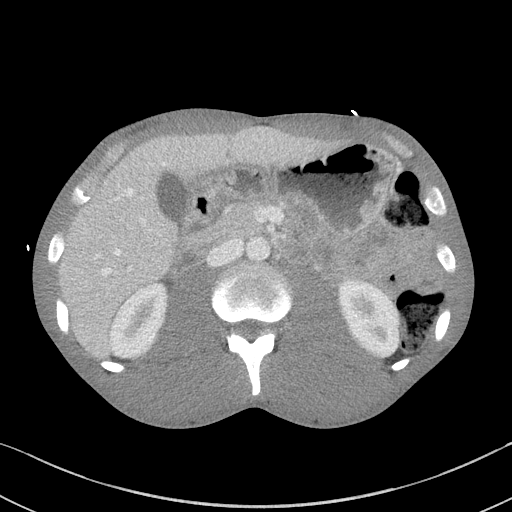
[im 71/89  soft-tissue]
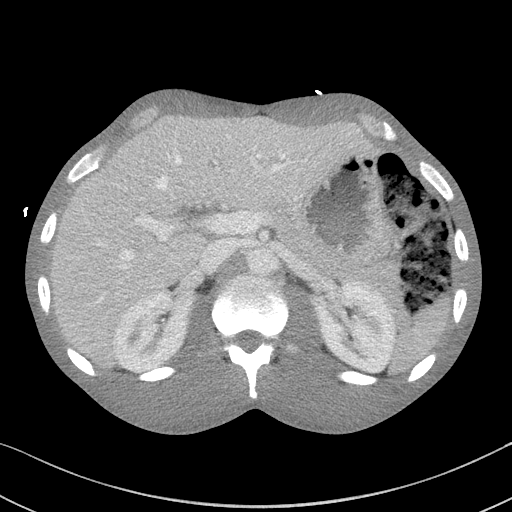
[im 78/89  soft-tissue]
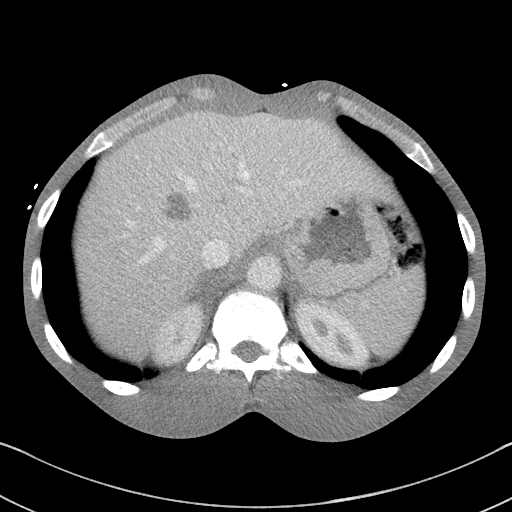
[im 85/89  soft-tissue]
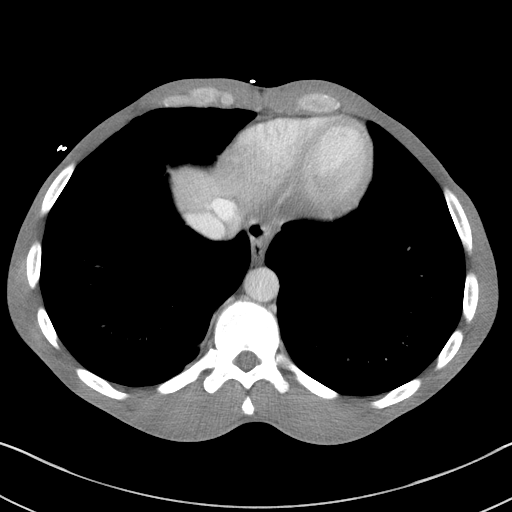

[Series 6: a/p w/ cor · coronal · 0.73mm/px · 3 of 138 slices shown]
[im 46/138  soft-tissue]
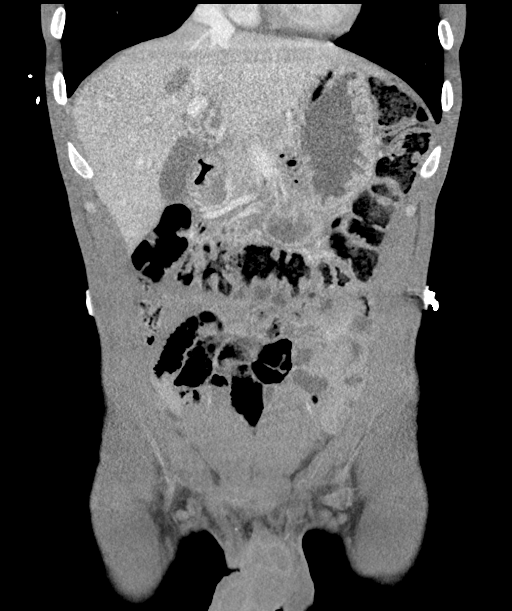
[im 61/138  soft-tissue]
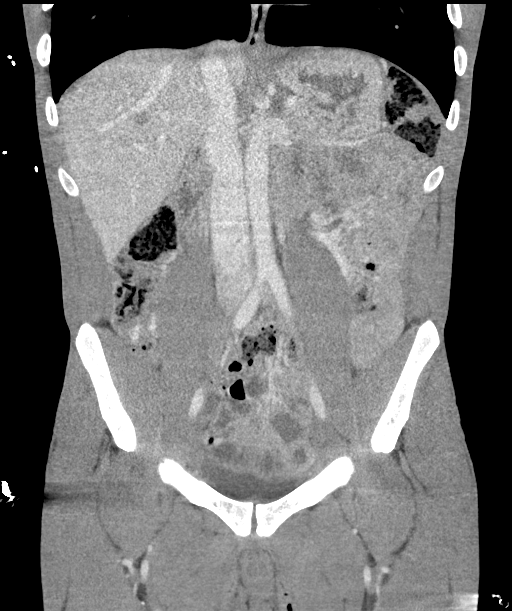
[im 77/138  soft-tissue]
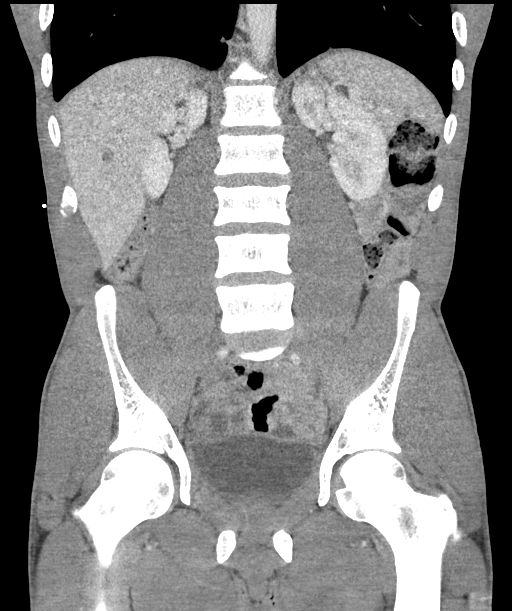

[16 of 46 positions shown; findings below may reference images not displayed]

FINDINGS: Lower chest: No acute abnormality.

Hepatobiliary: Multiple stable hypoattenuating foci measuring up to
2 cm at dome of liver with interrupted peripheral nodular
enhancement on prior CT, compatible with hemangioma. No gallstones,
gallbladder wall thickening, or biliary dilatation.

Pancreas: Unremarkable. No pancreatic ductal dilatation or
surrounding inflammatory changes.

Spleen: Normal in size without focal abnormality.

Adrenals/Urinary Tract: Adrenal glands are unremarkable. Kidneys are
normal, without renal calculi, focal lesion, or hydronephrosis.
Bladder is unremarkable.

Stomach/Bowel: Stomach is within normal limits. Appendix appears
normal. No evidence of bowel wall thickening, distention, or
inflammatory changes.

Vascular/Lymphatic: No significant vascular findings are present. No
enlarged abdominal or pelvic lymph nodes.

Reproductive: Prostate is unremarkable.

Other: No abdominal wall hernia or abnormality. No abdominopelvic
ascites.

Musculoskeletal: No acute or significant osseous findings.
IMPRESSION: 1. No acute process identified.
2. Stable hepatic hemangiomas.
